# Patient Record
Sex: Female | Born: 1945 | Race: Black or African American | Hispanic: No | Marital: Married | Smoking: Never smoker
Health system: Southern US, Community
[De-identification: ages and names within clinical notes are randomized; demographics above are authoritative.]

## PROBLEM LIST (undated history)

## (undated) DIAGNOSIS — E119 Type 2 diabetes mellitus without complications: Secondary | ICD-10-CM

## (undated) DIAGNOSIS — E785 Hyperlipidemia, unspecified: Secondary | ICD-10-CM

## (undated) DIAGNOSIS — G20A1 Parkinson's disease without dyskinesia, without mention of fluctuations: Secondary | ICD-10-CM

## (undated) DIAGNOSIS — E162 Hypoglycemia, unspecified: Secondary | ICD-10-CM

## (undated) DIAGNOSIS — F0390 Unspecified dementia without behavioral disturbance: Secondary | ICD-10-CM

## (undated) DIAGNOSIS — G20 Parkinson's disease: Secondary | ICD-10-CM

## (undated) DIAGNOSIS — I10 Essential (primary) hypertension: Secondary | ICD-10-CM

## (undated) HISTORY — DX: Hyperlipidemia, unspecified: E78.5

---

## 2012-11-06 DIAGNOSIS — E162 Hypoglycemia, unspecified: Secondary | ICD-10-CM

## 2012-11-06 HISTORY — DX: Hypoglycemia, unspecified: E16.2

## 2012-11-24 ENCOUNTER — Encounter (HOSPITAL_COMMUNITY): Payer: Self-pay | Admitting: Vascular Surgery

## 2012-11-24 ENCOUNTER — Inpatient Hospital Stay (HOSPITAL_COMMUNITY)
Admission: EM | Admit: 2012-11-24 | Discharge: 2012-11-27 | DRG: 637 | Disposition: A | Discharge status: Home / Self Care | Payer: Medicare (Managed Care) | Attending: Internal Medicine | Admitting: Internal Medicine

## 2012-11-24 DIAGNOSIS — G9341 Metabolic encephalopathy: Secondary | ICD-10-CM | POA: Diagnosis present

## 2012-11-24 DIAGNOSIS — R634 Abnormal weight loss: Secondary | ICD-10-CM | POA: Diagnosis present

## 2012-11-24 DIAGNOSIS — Z7982 Long term (current) use of aspirin: Secondary | ICD-10-CM

## 2012-11-24 DIAGNOSIS — E1169 Type 2 diabetes mellitus with other specified complication: Principal | ICD-10-CM | POA: Diagnosis present

## 2012-11-24 DIAGNOSIS — E876 Hypokalemia: Secondary | ICD-10-CM | POA: Diagnosis present

## 2012-11-24 DIAGNOSIS — Z79899 Other long term (current) drug therapy: Secondary | ICD-10-CM

## 2012-11-24 DIAGNOSIS — G309 Alzheimer's disease, unspecified: Secondary | ICD-10-CM | POA: Diagnosis present

## 2012-11-24 DIAGNOSIS — G20A1 Parkinson's disease without dyskinesia, without mention of fluctuations: Secondary | ICD-10-CM

## 2012-11-24 DIAGNOSIS — N182 Chronic kidney disease, stage 2 (mild): Secondary | ICD-10-CM

## 2012-11-24 DIAGNOSIS — E162 Hypoglycemia, unspecified: Secondary | ICD-10-CM

## 2012-11-24 DIAGNOSIS — F0390 Unspecified dementia without behavioral disturbance: Secondary | ICD-10-CM

## 2012-11-24 DIAGNOSIS — I129 Hypertensive chronic kidney disease with stage 1 through stage 4 chronic kidney disease, or unspecified chronic kidney disease: Secondary | ICD-10-CM | POA: Diagnosis present

## 2012-11-24 DIAGNOSIS — E119 Type 2 diabetes mellitus without complications: Secondary | ICD-10-CM

## 2012-11-24 DIAGNOSIS — R4182 Altered mental status, unspecified: Secondary | ICD-10-CM

## 2012-11-24 DIAGNOSIS — I10 Essential (primary) hypertension: Secondary | ICD-10-CM

## 2012-11-24 DIAGNOSIS — E785 Hyperlipidemia, unspecified: Secondary | ICD-10-CM

## 2012-11-24 DIAGNOSIS — G20 Parkinson's disease: Secondary | ICD-10-CM

## 2012-11-24 DIAGNOSIS — F0280 Dementia in other diseases classified elsewhere without behavioral disturbance: Secondary | ICD-10-CM | POA: Diagnosis present

## 2012-11-24 DIAGNOSIS — Z885 Allergy status to narcotic agent status: Secondary | ICD-10-CM

## 2012-11-24 DIAGNOSIS — T383X5A Adverse effect of insulin and oral hypoglycemic [antidiabetic] drugs, initial encounter: Secondary | ICD-10-CM | POA: Diagnosis present

## 2012-11-24 DIAGNOSIS — Y92009 Unspecified place in unspecified non-institutional (private) residence as the place of occurrence of the external cause: Secondary | ICD-10-CM

## 2012-11-24 HISTORY — DX: Essential (primary) hypertension: I10

## 2012-11-24 HISTORY — DX: Unspecified dementia, unspecified severity, without behavioral disturbance, psychotic disturbance, mood disturbance, and anxiety: F03.90

## 2012-11-24 HISTORY — DX: Parkinson's disease: G20

## 2012-11-24 HISTORY — DX: Hypoglycemia, unspecified: E16.2

## 2012-11-24 HISTORY — DX: Type 2 diabetes mellitus without complications: E11.9

## 2012-11-24 HISTORY — DX: Parkinson's disease without dyskinesia, without mention of fluctuations: G20.A1

## 2012-11-24 LAB — GLUCOSE, CAPILLARY
Glucose-Capillary: 58 mg/dL — ABNORMAL LOW (ref 70–99)
Glucose-Capillary: 17 mg/dL — CL (ref 70–99)
Glucose-Capillary: 62 mg/dL — ABNORMAL LOW (ref 70–99)
Glucose-Capillary: 76 mg/dL (ref 70–99)

## 2012-11-24 LAB — URINALYSIS, ROUTINE W REFLEX MICROSCOPIC
Bilirubin Urine: NEGATIVE
Hgb urine dipstick: NEGATIVE
Nitrite: NEGATIVE
Protein, ur: 30 mg/dL — AB
Specific Gravity, Urine: 1.025 (ref 1.005–1.030)
Urobilinogen, UA: 0.2 mg/dL (ref 0.0–1.0)

## 2012-11-24 LAB — CBC WITH DIFFERENTIAL/PLATELET
Basophils Absolute: 0 10*3/uL (ref 0.0–0.1)
Basophils Relative: 0 % (ref 0–1)
Eosinophils Absolute: 0.1 10*3/uL (ref 0.0–0.7)
HCT: 32.7 % — ABNORMAL LOW (ref 36.0–46.0)
MCH: 28.4 pg (ref 26.0–34.0)
MCHC: 33.3 g/dL (ref 30.0–36.0)
Monocytes Absolute: 0.5 10*3/uL (ref 0.1–1.0)
Monocytes Relative: 6 % (ref 3–12)
Neutro Abs: 6.9 10*3/uL (ref 1.7–7.7)
Neutrophils Relative %: 81 % — ABNORMAL HIGH (ref 43–77)
RDW: 12.4 % (ref 11.5–15.5)

## 2012-11-24 LAB — POCT I-STAT, CHEM 8
Chloride: 107 mEq/L (ref 96–112)
HCT: 34 % — ABNORMAL LOW (ref 36.0–46.0)
Hemoglobin: 11.6 g/dL — ABNORMAL LOW (ref 12.0–15.0)
Potassium: 2.9 mEq/L — ABNORMAL LOW (ref 3.5–5.1)
Sodium: 143 mEq/L (ref 135–145)

## 2012-11-24 LAB — CREATININE, SERUM
Creatinine, Ser: 1.07 mg/dL (ref 0.50–1.10)
GFR calc Af Amer: 60 mL/min — ABNORMAL LOW (ref >90)

## 2012-11-24 LAB — CBC
Platelets: 156 10*3/uL (ref 150–400)
RBC: 3.39 MIL/uL — ABNORMAL LOW (ref 3.87–5.11)
RDW: 12.3 % (ref 11.5–15.5)
WBC: 7 10*3/uL (ref 4.0–10.5)

## 2012-11-24 LAB — URINE MICROSCOPIC-ADD ON

## 2012-11-24 LAB — MRSA PCR SCREENING: MRSA by PCR: NEGATIVE

## 2012-11-24 MED ORDER — LOSARTAN POTASSIUM 50 MG PO TABS
100.0000 mg | ORAL_TABLET | Freq: Every day | ORAL | Status: DC
  Administered 2012-11-25 – 2012-11-27 (×3): 100 mg via ORAL
  Filled 2012-11-24 (×3): qty 2

## 2012-11-24 MED ORDER — ONDANSETRON HCL 4 MG/2ML IJ SOLN: 4.0000 mg | Freq: Four times a day (QID) | INTRAMUSCULAR | Status: DC | PRN

## 2012-11-24 MED ORDER — ATENOLOL 50 MG PO TABS
50.0000 mg | ORAL_TABLET | Freq: Every day | ORAL | Status: DC
  Administered 2012-11-25 – 2012-11-27 (×3): 50 mg via ORAL
  Filled 2012-11-24 (×3): qty 1

## 2012-11-24 MED ORDER — ALBUTEROL SULFATE (5 MG/ML) 0.5% IN NEBU: 2.5000 mg | INHALATION_SOLUTION | RESPIRATORY_TRACT | Status: DC | PRN

## 2012-11-24 MED ORDER — SODIUM CHLORIDE 0.9 % IJ SOLN
3.0000 mL | Freq: Two times a day (BID) | INTRAMUSCULAR | Status: DC
  Administered 2012-11-24 – 2012-11-26 (×3): 3 mL via INTRAVENOUS

## 2012-11-24 MED ORDER — DEXTROSE 50 % IV SOLN
50.0000 mL | Freq: Once | INTRAVENOUS | Status: AC
  Administered 2012-11-24: 50 mL via INTRAVENOUS
  Filled 2012-11-24: qty 50

## 2012-11-24 MED ORDER — DEXTROSE 10 % IV SOLN
INTRAVENOUS | Status: DC
  Administered 2012-11-24 – 2012-11-25 (×3): via INTRAVENOUS

## 2012-11-24 MED ORDER — CARBIDOPA-LEVODOPA 25-100 MG PO TABS
1.0000 | ORAL_TABLET | Freq: Three times a day (TID) | ORAL | Status: DC
  Administered 2012-11-24 – 2012-11-27 (×8): 1 via ORAL
  Filled 2012-11-24 (×11): qty 1

## 2012-11-24 MED ORDER — DONEPEZIL HCL 5 MG PO TABS
5.0000 mg | ORAL_TABLET | Freq: Every day | ORAL | Status: DC
  Administered 2012-11-25 – 2012-11-27 (×3): 5 mg via ORAL
  Filled 2012-11-24 (×3): qty 1

## 2012-11-24 MED ORDER — ACETAMINOPHEN 650 MG RE SUPP: 650.0000 mg | Freq: Four times a day (QID) | RECTAL | Status: DC | PRN

## 2012-11-24 MED ORDER — OCTREOTIDE ACETATE 50 MCG/ML IJ SOLN
50.0000 ug | Freq: Three times a day (TID) | INTRAMUSCULAR | Status: DC | PRN
  Filled 2012-11-24: qty 1

## 2012-11-24 MED ORDER — ATORVASTATIN CALCIUM 80 MG PO TABS
80.0000 mg | ORAL_TABLET | Freq: Every evening | ORAL | Status: DC
  Administered 2012-11-24 – 2012-11-26 (×3): 80 mg via ORAL
  Filled 2012-11-24 (×4): qty 1

## 2012-11-24 MED ORDER — DEXTROSE 50 % IV SOLN: 1.0000 | Freq: Once | INTRAVENOUS | Status: DC

## 2012-11-24 MED ORDER — POTASSIUM CHLORIDE CRYS ER 20 MEQ PO TBCR
30.0000 meq | EXTENDED_RELEASE_TABLET | ORAL | Status: AC
  Administered 2012-11-24 – 2012-11-25 (×2): 30 meq via ORAL
  Filled 2012-11-24 (×3): qty 1

## 2012-11-24 MED ORDER — ONDANSETRON HCL 4 MG PO TABS: 4.0000 mg | ORAL_TABLET | Freq: Four times a day (QID) | ORAL | Status: DC | PRN

## 2012-11-24 MED ORDER — ZOLPIDEM TARTRATE 5 MG PO TABS: 5.0000 mg | ORAL_TABLET | Freq: Every evening | ORAL | Status: DC | PRN

## 2012-11-24 MED ORDER — ASPIRIN EC 81 MG PO TBEC
81.0000 mg | DELAYED_RELEASE_TABLET | Freq: Every day | ORAL | Status: DC
  Administered 2012-11-25 – 2012-11-26 (×2): 81 mg via ORAL
  Filled 2012-11-24 (×3): qty 1

## 2012-11-24 MED ORDER — DEXTROSE 50 % IV SOLN
25.0000 mL | Freq: Once | INTRAVENOUS | Status: AC
  Administered 2012-11-24: 25 mL via INTRAVENOUS
  Filled 2012-11-24: qty 50

## 2012-11-24 MED ORDER — HEPARIN SODIUM (PORCINE) 5000 UNIT/ML IJ SOLN
5000.0000 [IU] | Freq: Three times a day (TID) | INTRAMUSCULAR | Status: DC
  Administered 2012-11-24 – 2012-11-27 (×5): 5000 [IU] via SUBCUTANEOUS
  Filled 2012-11-24 (×11): qty 1

## 2012-11-24 MED ORDER — ALUM & MAG HYDROXIDE-SIMETH 200-200-20 MG/5ML PO SUSP: 30.0000 mL | Freq: Four times a day (QID) | ORAL | Status: DC | PRN

## 2012-11-24 MED ORDER — ACETAMINOPHEN 325 MG PO TABS
650.0000 mg | ORAL_TABLET | Freq: Four times a day (QID) | ORAL | Status: DC | PRN
  Administered 2012-11-25: 650 mg via ORAL
  Filled 2012-11-24: qty 2

## 2012-11-24 NOTE — ED Provider Notes (Signed)
I saw and evaluated the patient, reviewed the resident's note and I agree with the findings and plan.    Gilda Crease, MD 11/24/12 [phone]

## 2012-11-24 NOTE — ED Notes (Signed)
Pt noted to become shaky and diaphoretic. CBG checked and revealed CBG of 17 mg/dl. 25 mg of D50 given and D10 drip started. Pt also given orange juice and a meal. Will reassess in a few minutes.

## 2012-11-24 NOTE — ED Provider Notes (Signed)
I saw and evaluated the patient, reviewed the resident's note and I agree with the findings and plan.  Patient seen for refractory hypoglycemia. Patient is on an oral diabetes medication and has had recurrent hypotension despite multiple IV dextrose administrations. Patient will require hospitalization for further management.  Gilda Crease, MD 11/24/12 [phone]

## 2012-11-24 NOTE — H&P (Addendum)
PATIENT DETAILS Name: Kristie Ellison Age: 67 y.o. Sex: female Date of Birth: 1946/02/03 Admit Date: 11/24/2012 PCP:Pcp Not In System   CHIEF COMPLAINT:  Hypoglycemia  HPI: Patient is a 67 year old African American female with a history of type 2 diabetes on glimepiride, hypertension, moderate to severe dementia, Parkinson's disease who recently moved down to [place] from [place] to be closer to her daughter was brought to the hospital for evaluation of hypoglycemic symptoms. Apparently the patient was in his usual state of health until last evening when she was noticed to be more tremulous, diaphoretic and just not acting herself. Her family members did not check her sugars then, early this morning she again had recurrence of the symptoms prompting her daughter to check her sugar which was in the 30s. Patient was then brought to the emergency room, where she was found to be persistently hypoglycemic, she was placed on a D. 10 infusion and I was called to admit this patient for further evaluation and treatment Patient does not have a history of headaches, fever, nausea, vomiting, diarrhea. Last dose of amaryl was earlier this am   ALLERGIES:   Allergies  Allergen Reactions  . Codeine Anxiety    Pt reports she goes crazy    PAST MEDICAL HISTORY: Past Medical History  Diagnosis Date  . Diabetes mellitus without complication   . Hypertension   . Dementia   . Parkinson disease     PAST SURGICAL HISTORY: History reviewed. No pertinent past surgical history.  MEDICATIONS AT HOME: Prior to Admission medications   Medication Sig Start Date End Date Taking? Authorizing Provider  aspirin EC 81 MG tablet Take 81 mg by mouth daily at 2 PM daily at 2 PM.   Yes Historical Provider, MD  atenolol (TENORMIN) 50 MG tablet Take 50 mg by mouth daily at 2 PM daily at 2 PM.   Yes Historical Provider, MD  atorvastatin (LIPITOR) 80 MG tablet Take 80 mg by mouth every evening.   Yes Historical  Provider, MD  carbidopa-levodopa (SINEMET IR) 25-100 MG per tablet Take 1 tablet by mouth 3 (three) times daily.   Yes Historical Provider, MD  Cholecalciferol (VITAMIN D PO) Take 1 capsule by mouth once a week. sunday   Yes Historical Provider, MD  donepezil (ARICEPT) 5 MG tablet Take 5 mg by mouth daily at 2 PM daily at 2 PM.   Yes Historical Provider, MD  glimepiride (AMARYL) 4 MG tablet Take 4 mg by mouth daily before breakfast.   Yes Historical Provider, MD  losartan (COZAAR) 100 MG tablet Take 100 mg by mouth daily at 2 PM daily at 2 PM.   Yes Historical Provider, MD    FAMILY HISTORY: No significant family history of coronary artery disease.  SOCIAL HISTORY:  reports that she has never smoked. She does not have any smokeless tobacco history on file. She reports that she does not drink alcohol or use illicit drugs.  REVIEW OF SYSTEMS:  Constitutional:   No  weight loss,   Fevers, chills, fatigue.  HEENT:    No headaches, Difficulty swallowing,Tooth/dental problems,Sore throat,  No sneezing, itching, ear ache, nasal congestion, post nasal drip,   Cardio-vascular: No chest pain,  Orthopnea, PND, swelling in lower extremities, anasarca, dizziness, palpitations  GI:  No heartburn, indigestion, abdominal pain, nausea, vomiting, diarrhea, change in  bowel habits, loss of appetite  Resp: No shortness of breath with exertion or at rest.  No excess mucus, no productive cough, No non-productive cough,  No coughing up of blood.No change in color of mucus.No wheezing.No chest wall deformity  Skin:  no rash or lesions.  GU:  no dysuria, change in color of urine, no urgency or frequency.  No flank pain.  Musculoskeletal: No joint pain or swelling.  No decreased range of motion.  No back pain.  Psych: No change in mood or affect. No depression or anxiety.  No memory loss.   PHYSICAL EXAM: Blood pressure 125/66, pulse 92, temperature 98.3 F (36.8 C), temperature source Oral,  resp. rate 24, SpO2 99.00%.  General appearance :Awake, mostly alert alert, not in any distress. Speech Clear. Not toxic Looking HEENT: Atraumatic and Normocephalic, pupils equally reactive to light and accomodation Neck: supple, no JVD. No cervical lymphadenopathy.  Chest:Good air entry bilaterally, no added sounds  CVS: S1 S2 regular, no murmurs.  Abdomen: Bowel sounds present, Non tender and not distended with no gaurding, rigidity or rebound. Extremities: B/L Lower Ext shows no edema, both legs are warm to touch Neurology: Awake alert, and oriented X 3, CN II-XII intact, Non focal Skin:No Rash Wounds:N/A  LABS ON ADMISSION:   Recent Labs  11/24/12 1613  NA 143  K 2.9*  CL 107  GLUCOSE 57*  BUN 24*  CREATININE 1.20*   No results found for this basename: AST, ALT, ALKPHOS, BILITOT, PROT, ALBUMIN,  in the last 72 hours No results found for this basename: LIPASE, AMYLASE,  in the last 72 hours  Recent Labs  11/24/12 1513 11/24/12 1613  WBC 8.5  --   NEUTROABS 6.9  --   HGB 10.9* 11.6*  HCT 32.7* 34.0*  MCV 85.2  --   PLT 178  --    No results found for this basename: CKTOTAL, CKMB, CKMBINDEX, TROPONINI,  in the last 72 hours No results found for this basename: DDIMER,  in the last 72 hours No components found with this basename: POCBNP,    RADIOLOGIC STUDIES ON ADMISSION: No results found.  ASSESSMENT AND PLAN: Present on Admission:  . Hypoglycemia - Likely secondary to oral hypoglycemic agents-namely Glimiperide. She does have mild renal insufficiency, likely contributing as well.  - Admit to step down unit, continue with D. 10 infusion, if need be we can give her more dextrose infusion. Will use as needed D50 for CBGs less than 60, if her hypoglycemia is still persistent, will use subcutaneous octreotide injections.  - Check A1c, we need to liberalize glycemic control-avoid tight glycemic control in the future. Apparently this is her second episode of  hypoglycemia, she had a similar episode in January of February of last year while she was in [place]  - Placed on a regular diet for now  . Altered mental status - Likely metabolic encephalopathy from severe hypoglycemia-seems to have resolved during my evaluation. Per her daughter-currently close to her baseline   . Diabetes mellitus - Hold all oral hypoglycemic agents for now, check A1c. Rest as above   . HTN (hypertension) - Continue with her home regimen - monitor BP closely and adjust medications accordingly   . Dementia - At baseline.  - Continue Aricept   . Parkinson disease - Continue with Sinemet  Further plan will depend as patient's clinical course evolves and further radiologic and laboratory data become available. Patient will be monitored closely.   DVT Prophylaxis: - Subcutaneous heparin  Code Status: - Full code  Total time spent for admission equals 45 minutes.  [facility] Pager [phone]  If 7PM-7AM, please contact  night-coverage [email or web address] Password [facility] 11/24/2012, 5:08 PM

## 2012-11-24 NOTE — ED Provider Notes (Signed)
History     CSN: [id number]  Arrival date & time 11/24/12  1522  Chief Complaint  Patient presents with  . Hypoglycemia    HPI  67 y/o female with history of Parkinson's dementia, HTN, DM (on oral medications) who presents with cc of AMS and hypoglycemia. The patient's daughter primarily manages the patient's medications. She states that this morning the patient received her usual dose of glimepiride. She states that this morning the patient appeared to be clammy, diaphoretic, and more confused that normal. Today the patient became acutely lethargic and confused. The patient's daughter took her blood sugar and realized it was 37. When EMS arrived the patient received 25 g of glucose. The patient's daughter states the patient had a normal breakfast and lunch today. The patient's daughter states the patient had a similar episode a few months prior. No new medication changes. No infectious symptoms.   Past Medical History  Diagnosis Date  . Diabetes mellitus without complication   . Hypertension   . Dementia   . Parkinson disease     History reviewed. No pertinent past surgical history.  History reviewed. No pertinent family history.  History  Substance Use Topics  . Smoking status: Never Smoker   . Smokeless tobacco: Not on file  . Alcohol Use: No    OB History   Grav Para Term Preterm Abortions TAB SAB Ect Mult Living                 Review of Systems  Constitutional: Positive for diaphoresis. Negative for fever and chills.  HENT: Negative for congestion and rhinorrhea.   Respiratory: Negative for cough.   Cardiovascular: Negative for chest pain.  Gastrointestinal: Negative for nausea, vomiting and abdominal pain.  Genitourinary: Negative for dysuria and frequency.  Skin: Negative for rash.  Psychiatric/Behavioral: Positive for confusion.  All other systems reviewed and are negative.   Allergies  Codeine  Home Medications   Current Outpatient Rx  Name  Route  Sig   Dispense  Refill  . aspirin EC 81 MG tablet   Oral   Take 81 mg by mouth daily at 2 PM daily at 2 PM.         . atenolol (TENORMIN) 50 MG tablet   Oral   Take 50 mg by mouth daily at 2 PM daily at 2 PM.         . atorvastatin (LIPITOR) 80 MG tablet   Oral   Take 80 mg by mouth every evening.         . carbidopa-levodopa (SINEMET IR) 25-100 MG per tablet   Oral   Take 1 tablet by mouth 3 (three) times daily.         . Cholecalciferol (VITAMIN D PO)   Oral   Take 1 capsule by mouth once a week. sunday         . donepezil (ARICEPT) 5 MG tablet   Oral   Take 5 mg by mouth daily at 2 PM daily at 2 PM.         . glimepiride (AMARYL) 4 MG tablet   Oral   Take 4 mg by mouth daily before breakfast.         . losartan (COZAAR) 100 MG tablet   Oral   Take 100 mg by mouth daily at 2 PM daily at 2 PM.           BP 141/65  Pulse 79  Temp(Src) 98.3 F (36.8 C) (Oral)  Resp 21  SpO2 100%  Physical Exam  Nursing note and vitals reviewed. Constitutional: She appears well-developed and well-nourished. No distress.  HENT:  Head: Normocephalic and atraumatic.  Mouth/Throat: No oropharyngeal exudate.  Eyes: Conjunctivae are normal. Pupils are equal, round, and reactive to light.  Neck: Normal range of motion. Neck supple.  Cardiovascular: Normal rate and regular rhythm.  Exam reveals no gallop and no friction rub.   No murmur heard. Pulmonary/Chest: Effort normal and breath sounds normal.  Abdominal: Soft. She exhibits no distension. There is no tenderness.  Musculoskeletal: Normal range of motion. She exhibits no edema and no tenderness.  Neurological: She is alert. She has normal strength. No cranial nerve deficit or sensory deficit. GCS eye subscore is 4. GCS verbal subscore is 5. GCS motor subscore is 6.  AAO to self and place. Not oriented to date. (baseline per family).  Skin: Skin is warm and dry.  Psychiatric: She has a normal mood and affect.    ED  Course  Procedures (including critical care time)  Labs Reviewed  GLUCOSE, CAPILLARY - Abnormal; Notable for the following:    Glucose-Capillary 26 (*)    All other components within normal limits  CBC WITH DIFFERENTIAL - Abnormal; Notable for the following:    RBC 3.84 (*)    Hemoglobin 10.9 (*)    HCT 32.7 (*)    Neutrophils Relative 81 (*)    All other components within normal limits  URINALYSIS, ROUTINE W REFLEX MICROSCOPIC - Abnormal; Notable for the following:    Glucose, UA 100 (*)    Protein, ur 30 (*)    Leukocytes, UA SMALL (*)    All other components within normal limits  GLUCOSE, CAPILLARY - Abnormal; Notable for the following:    Glucose-Capillary 58 (*)    All other components within normal limits  GLUCOSE, CAPILLARY - Abnormal; Notable for the following:    Glucose-Capillary 17 (*)    All other components within normal limits  URINE MICROSCOPIC-ADD ON - Abnormal; Notable for the following:    Squamous Epithelial / LPF MANY (*)    Bacteria, UA MANY (*)    All other components within normal limits  GLUCOSE, CAPILLARY - Abnormal; Notable for the following:    Glucose-Capillary 62 (*)    All other components within normal limits  POCT I-STAT, CHEM 8 - Abnormal; Notable for the following:    Potassium 2.9 (*)    BUN 24 (*)    Creatinine, Ser 1.20 (*)    Glucose, Bld 57 (*)    Hemoglobin 11.6 (*)    HCT 34.0 (*)    All other components within normal limits  URINE CULTURE  GLUCOSE, CAPILLARY   No results found.   1. Hypoglycemia   2. Altered mental status   3. Dementia   4. Diabetes mellitus     MDM   67 y/o female with history of Parkinson's dementia, HTN, DM (on oral medications) who presents with cc of AMS and hypoglycemia. Afebrile. Non-focal neurological exam. BS 26 on arrival. Family reports administering home medication this morning as usual. No recent infectious symptoms. The patient ate today as usual. No vomiting. Hypoglycemia could be secondary  to inadvertent extra dosage of glimerpide or infection. In addition, it is possible that the patient is now having tighter glucose control secondary to weight loss over the last year. Family is unsure of last A1C but the daughter states she checks her blood glucose every 3 days and the highest  reading lately was ~120. Repeat FSBG after D50 was persistently hypoglycemic. The patient was given and additional 0.5 amp and started on a D10 gtt. The patient was admitted to general medicine stepdown unit for further management.     Shanon Ace, MD 11/24/12 [phone]

## 2012-11-24 NOTE — Progress Notes (Signed)
Pt admitted from ED, alert, confused, CBG stable, VSS, family oriented to unit and routines, bed alarm set, family verbalizes understanding of safety and unit routines

## 2012-11-24 NOTE — ED Notes (Signed)
Pt reports to the ED for eval of hypoglycemia. Yesterday evening pt had an episode where she became clammy and diaphoretic and more confused than normal however it resolved. Pt experienced another episode of these symptoms today and she went unresponsive. Upon EMS arrival pts CBG 31 mg/dl. Pt has 25 g of dextrose PTA. CBG 58 mg/dl en route. Pt has hx of dementia. Pt is oriented to person only. Per family pts mental status is baseline for her. Pts only complaint at this time is she feels cold. Pt in NAD and denies any pain.

## 2012-11-25 ENCOUNTER — Encounter (HOSPITAL_COMMUNITY): Payer: Self-pay | Admitting: General Practice

## 2012-11-25 DIAGNOSIS — N182 Chronic kidney disease, stage 2 (mild): Secondary | ICD-10-CM | POA: Diagnosis present

## 2012-11-25 DIAGNOSIS — E785 Hyperlipidemia, unspecified: Secondary | ICD-10-CM | POA: Diagnosis present

## 2012-11-25 LAB — CBC
HCT: 29.8 % — ABNORMAL LOW (ref 36.0–46.0)
Hemoglobin: 10 g/dL — ABNORMAL LOW (ref 12.0–15.0)
MCH: 28.5 pg (ref 26.0–34.0)
MCHC: 33.6 g/dL (ref 30.0–36.0)
MCV: 84.9 fL (ref 78.0–100.0)
RBC: 3.51 MIL/uL — ABNORMAL LOW (ref 3.87–5.11)

## 2012-11-25 LAB — MAGNESIUM: Magnesium: 1.7 mg/dL (ref 1.5–2.5)

## 2012-11-25 LAB — COMPREHENSIVE METABOLIC PANEL
ALT: 5 U/L (ref 0–35)
BUN: 17 mg/dL (ref 6–23)
CO2: 22 mEq/L (ref 19–32)
Calcium: 8.9 mg/dL (ref 8.4–10.5)
Creatinine, Ser: 1 mg/dL (ref 0.50–1.10)
GFR calc Af Amer: 66 mL/min — ABNORMAL LOW (ref >90)
GFR calc non Af Amer: 57 mL/min — ABNORMAL LOW (ref >90)
Glucose, Bld: 111 mg/dL — ABNORMAL HIGH (ref 70–99)
Sodium: 141 mEq/L (ref 135–145)
Total Protein: 5.9 g/dL — ABNORMAL LOW (ref 6.0–8.3)

## 2012-11-25 LAB — URINE CULTURE: Culture: NO GROWTH

## 2012-11-25 LAB — GLUCOSE, CAPILLARY
Glucose-Capillary: 69 mg/dL — ABNORMAL LOW (ref 70–99)
Glucose-Capillary: 73 mg/dL (ref 70–99)
Glucose-Capillary: 101 mg/dL — ABNORMAL HIGH (ref 70–99)
Glucose-Capillary: 121 mg/dL — ABNORMAL HIGH (ref 70–99)

## 2012-11-25 LAB — HEMOGLOBIN A1C: Hgb A1c MFr Bld: 5.1 % (ref <5.7)

## 2012-11-25 MED ORDER — DEXTROSE 5 % IV SOLN
INTRAVENOUS | Status: DC
  Administered 2012-11-25: 50 mL via INTRAVENOUS

## 2012-11-25 MED ORDER — PNEUMOCOCCAL VAC POLYVALENT 25 MCG/0.5ML IJ INJ
0.5000 mL | INJECTION | INTRAMUSCULAR | Status: AC
  Filled 2012-11-25: qty 0.5

## 2012-11-25 MED ORDER — INFLUENZA VIRUS VACC SPLIT PF IM SUSP
0.5000 mL | INTRAMUSCULAR | Status: AC
  Filled 2012-11-25: qty 0.5

## 2012-11-25 NOTE — Progress Notes (Signed)
Spoke with patients daughter about flu/ PNA vaccines, daughter states she wants to talk it over with her other sister and they will let us know.

## 2012-11-25 NOTE — Progress Notes (Signed)
Patient being transferred to [facility] per MD , Report called to [facility], patient will transfer in wheel chair.

## 2012-11-25 NOTE — Progress Notes (Signed)
Inpatient Diabetes Program Recommendations  AACE/ADA: New Consensus Statement on Inpatient Glycemic Control (2013)  Target Ranges:  Prepandial:   less than 140 mg/dL      Peak postprandial:   less than 180 mg/dL (1-2 hours)      Critically ill patients:  140 - 180 mg/dL   Reason for Visit: CBGs 3/20  115-121-222 mg/dl       2/13  08/65-78-46-96/29 mg/dl  Inpatient Diabetes Program Recommendations Correction (SSI): start Novolog SENSITIVE correction scale AC & HS if CBGs continue greater than 180 mg/dl.  Note:

## 2012-11-25 NOTE — Evaluation (Signed)
Physical Therapy Evaluation Patient Details Name: Kristie Ellison MRN: [id number] DOB: 05/15/1946 Today's Date: 11/25/2012 Time: 4098-1191 PT Time Calculation (min): 32 min  PT Assessment / Plan / Recommendation Clinical Impression  67 y.o. female admitted to [facility] for hypoglycemia and has h/o Parkinson's and baseline dementia.  She would benefit from HHPT f/u at discharge to work on gait and balance.      PT Assessment  Patient needs continued PT services    Follow Up Recommendations  Home health PT;Supervision/Assistance - 24 hour    Does the patient have the potential to tolerate intense rehabilitation     NA  Barriers to Discharge None None    Equipment Recommendations  None recommended by PT    Recommendations for Other Services   none  Frequency Min 3X/week    Precautions / Restrictions Precautions Precautions: Fall Precaution Comments: pt standing in middle of room staggering around without RW wraped up in IV when PT entered room.  Must have bed or chair alarm.     Pertinent Vitals/Pain No reports of pain      Mobility  Bed Mobility Bed Mobility: Sit to Supine Sit to Supine: 3: Mod assist Details for Bed Mobility Assistance: mod assist to help bil legs back into the bed Transfers Transfers: Sit to Stand;Stand to Sit Sit to Stand: 4: Min assist;With upper extremity assist;From bed Stand to Sit: 4: Min assist;With upper extremity assist;To bed;To toilet Details for Transfer Assistance: min assist to steady pt for balance during transitions Ambulation/Gait Ambulation/Gait Assistance: 4: Min assist Ambulation Distance (Feet): 100 Feet Assistive device: Rolling walker Ambulation/Gait Assistance Details: min assist for 2-3 significant LOB that if PT had not been there would have resulted in a fall even with RW.   Gait Pattern: Step-through pattern;Shuffle;Trunk flexed (staggering to left and right.  ) Gait velocity: less than 1.8 ft/sec putting her at risk for  recurrent falls General Gait Details: Pt easily distracted in busy hallway.  Difficult to keep focused on walking task and increased LOB while trying to talk to everyone in the hallway.          PT Diagnosis: Difficulty walking;Abnormality of gait;Generalized weakness;Altered mental status  PT Problem List: Decreased strength;Decreased activity tolerance;Decreased balance;Decreased mobility;Decreased cognition;Decreased safety awareness PT Treatment Interventions: DME instruction;Gait training;Stair training;Functional mobility training;Therapeutic activities;Therapeutic exercise;Balance training;Neuromuscular re-education;Cognitive remediation;Patient/family education   PT Goals Acute Rehab PT Goals PT Goal Formulation: Patient unable to participate in goal setting Time For Goal Achievement: 11/25/12 Potential to Achieve Goals: Good Pt will go Supine/Side to Sit: with supervision PT Goal: Supine/Side to Sit - Progress: Goal set today Pt will go Sit to Supine/Side: with supervision PT Goal: Sit to Supine/Side - Progress: Goal set today Pt will go Sit to Stand: with supervision PT Goal: Sit to Stand - Progress: Goal set today Pt will go Stand to Sit: with supervision PT Goal: Stand to Sit - Progress: Goal set today Pt will Transfer Bed to Chair/Chair to Bed: with supervision PT Transfer Goal: Bed to Chair/Chair to Bed - Progress: Goal set today Pt will Ambulate: >150 feet;with supervision;with rolling walker PT Goal: Ambulate - Progress: Goal set today Pt will Go Up / Down Stairs: 3-5 stairs;with min assist;with least restrictive assistive device PT Goal: Up/Down Stairs - Progress: Goal set today  Visit Information  Last PT Received On: 11/25/12 Assistance Needed: +1    Subjective Data  Subjective: Pt reports that she is in [place] and I can't convince her that she is  not.  H/o Parkinson's dementia   Prior Functioning  Home Living Lives With: Family Available Help at  Discharge: Family Additional Comments: unable to fully assess due to pt's cognition Prior Function Level of Independence: Needs assistance Comments: walks with RW at home Communication Communication: No difficulties    Cognition  Cognition Overall Cognitive Status: History of cognitive impairments - at baseline Arousal/Alertness: Awake/alert Orientation Level: Disoriented to;Place;Time;Situation Behavior During Session: Restless Cognition - Other Comments: impulsive with decreased sustained attention    Extremity/Trunk Assessment Right Lower Extremity Assessment RLE Sensation: Deficits RLE Sensation Deficits: 3+/5 per gross functional assessment Left Lower Extremity Assessment LLE ROM/Strength/Tone: Deficits LLE ROM/Strength/Tone Deficits: grossly 3+/5 per gross functional assessment   Balance Static Sitting Balance Static Sitting - Balance Support: Bilateral upper extremity supported;Feet supported Static Sitting - Level of Assistance: 5: Stand by assistance Dynamic Sitting Balance Dynamic Sitting - Balance Support: No upper extremity supported;Feet supported Dynamic Sitting - Level of Assistance: 4: Min assist Dynamic Sitting - Comments: min assist while trying to donn socks in sitting,  LOB anteriorly Static Standing Balance Static Standing - Balance Support: Bilateral upper extremity supported Static Standing - Level of Assistance: 5: Stand by assistance Static Standing - Comment/# of Minutes: while washing hands at sink Dynamic Standing Balance Dynamic Standing - Balance Support: No upper extremity supported Dynamic Standing - Level of Assistance: 4: Min assist Dynamic Standing - Comments: min assist while performing toileting tasks in standing.    End of Session PT - End of Session Activity Tolerance: Patient limited by fatigue Patient left: in bed;with call bell/phone within reach;with bed alarm set       Pasqualino Witherspoon B. Montray Kliebert, PT, DPT [phone]   11/25/2012, 4:37  PM

## 2012-11-25 NOTE — Progress Notes (Signed)
[facility] Progress Note [place] TEAM 1 - Stepdown/ICU TEAM   Kateleen Encarnacion ZOX:[id number] DOB: Dec 21, 1945 DOA: 11/24/2012 PCP: Pcp Not In System  Brief narrative: 67 year old female patient with known type 2 diabetes on midparietal also has hypertension. 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 Because of this her family moved her from [place] to [place]. She was brought to the hospital because of symptomatic hypoglycemia. When the patient began having diaphoresis with tremulousness and altered behavior the daughter checked her blood sugar and this was found to be in the 30s. She was subsequently brought to the ER and placed on a D10 infusion. Patient normally takes Amaryl at home for diabetes.  Assessment/Plan: Active Problems:   Hypoglycemia/Diabetes mellitus type 2, controlled, without complications -CBG >100 on D10 infusion- will decrease to D5W and follow -change CBG check to q 4 hrs -continue regular diet until establishes more consistent CBG readings -dtr says pt with significant weight loss over past 8 months- likely this weight loss lead to lower need for glycemic control -+/- will need rxn tx for diabetes after dc    Altered mental status -due to hypoglycemia- resolving -influenced by pts Parkinson's dementia    HTN (hypertension) -controlled -cont. Tenormin and Cozaar    CKD (chronic kidney disease) stage 2, GFR 60-89 ml/min -follow- GFR around 60    Dementia/Parkinson disease -continue Sinemet and Aricept    Dyslipidemia -cont. lipitor   DVT prophylaxis: Subcutaneous heparin Code Status: Full Family Communication: Daughter and patient Disposition Plan: Transfer to floor Isolation: None  Consultants: None  Procedures: None  Antibiotics: None  HPI/Subjective: Patient alert but clearly has short-term memory as well as processing deficits related to her underlying dementia. Daughter in room and multiple questions  answered. Daughter clarifies patient with significant weight loss over the past few months.   Objective: Blood pressure 129/66, pulse 77, temperature 98.2 F (36.8 C), temperature source Oral, resp. rate 17, height 5\' 5"  (1.651 m), weight 56.1 kg (123 lb 10.9 oz), SpO2 100.00%.  Intake/Output Summary (Last 24 hours) at 11/25/12 1335 Last data filed at 11/25/12 1200  Gross per 24 hour  Intake    483 ml  Output    700 ml  Net   -217 ml     Exam: General: No acute respiratory distress Lungs: Clear to auscultation bilaterally without wheezes or crackles Cardiovascular: Regular rate and rhythm without murmur gallop or rub normal S1 and S2 Abdomen: Nontender, nondistended, soft, bowel sounds positive, no rebound, no ascites, no appreciable mass Extremities: No significant cyanosis, clubbing, or edema bilateral lower extremities  Data Reviewed: Basic Metabolic Panel:  Recent Labs Lab 11/24/12 1613 11/24/12 1941 11/25/12 0330  NA 143  --  141  K 2.9*  --  3.2*  CL 107  --  109  CO2  --   --  22  GLUCOSE 57*  --  111*  BUN 24*  --  17  CREATININE 1.20* 1.07 1.00  CALCIUM  --   --  8.9  MG  --   --  1.7   Liver Function Tests:  Recent Labs Lab 11/25/12 0330  AST 30  ALT 5  ALKPHOS 63  BILITOT 0.3  PROT 5.9*  ALBUMIN 3.1*   No results found for this basename: LIPASE, AMYLASE,  in the last 168 hours No results found for this basename: AMMONIA,  in the last 168 hours CBC:  Recent Labs Lab 11/24/12 1513 11/24/12 1613 11/24/12 1941 11/25/12 0330  WBC 8.5  --  7.0 6.0  NEUTROABS 6.9  --   --   --   HGB 10.9* 11.6* 9.6* 10.0*  HCT 32.7* 34.0* 29.0* 29.8*  MCV 85.2  --  85.5 84.9  PLT 178  --  156 158   Cardiac Enzymes: No results found for this basename: CKTOTAL, CKMB, CKMBINDEX, TROPONINI,  in the last 168 hours BNP (last 3 results) No results found for this basename: PROBNP,  in the last 8760 hours CBG:  Recent Labs Lab 11/25/12 0149 11/25/12 0337  11/25/12 0611 11/25/12 0741 11/25/12 1134  GLUCAP 87 101* 115* 121* 222*    Recent Results (from the past 240 hour(s))  MRSA PCR SCREENING     Status: None   Collection Time    11/24/12  6:46 PM      Result Value Range Status   MRSA by PCR NEGATIVE  NEGATIVE Final   Comment:            The GeneXpert MRSA Assay (FDA     approved for NASAL specimens     only), is one component of a     comprehensive MRSA colonization     surveillance program. It is not     intended to diagnose MRSA     infection nor to guide or     monitor treatment for     MRSA infections.     Studies:  Recent x-ray studies have been reviewed in detail by the Attending Physician  Scheduled Meds:  Reviewed in detail by the Attending Physician   Junious Silk, ANP [facility] Office  [phone] Pager [phone]  On-Call/Text Page:      [email or web address]      password [email or web address]  If 7PM-7AM, please contact night-coverage [email or web address] Password [email or web address] 11/25/2012, 1:35 PM   LOS: 1 day

## 2012-11-25 NOTE — Progress Notes (Signed)
Utilization review completed.  

## 2012-11-25 NOTE — Progress Notes (Signed)
Patient seen and examined. Agree with note by Junious Silk, NP. Patient admitted for severe hypoglycemia. Thought related to amaryl use. Patient has lost 40 pounds, and likely her diabetic meds requirement have gone down significantly. She is on a D-10 drip and CBGs remain in the low 100s. Will decrease to a D-5 drip and change CBGs to q 4 hours. Can transfer to a regular bed today. If CBGs stable overnight, can take her off the dextrose drip and give her another 24 hours to see if her CBGs stabilize. If so, she can be discharged home in aprox 48 hours. I would recommend no oral hypoglycemic agents at this time.  Peggye Pitt, MD [facility] Pager: [phone]

## 2012-11-26 DIAGNOSIS — N182 Chronic kidney disease, stage 2 (mild): Secondary | ICD-10-CM

## 2012-11-26 LAB — GLUCOSE, CAPILLARY
Glucose-Capillary: 109 mg/dL — ABNORMAL HIGH (ref 70–99)
Glucose-Capillary: 111 mg/dL — ABNORMAL HIGH (ref 70–99)
Glucose-Capillary: 117 mg/dL — ABNORMAL HIGH (ref 70–99)
Glucose-Capillary: 125 mg/dL — ABNORMAL HIGH (ref 70–99)
Glucose-Capillary: 138 mg/dL — ABNORMAL HIGH (ref 70–99)

## 2012-11-26 MED ORDER — POTASSIUM CHLORIDE CRYS ER 20 MEQ PO TBCR
40.0000 meq | EXTENDED_RELEASE_TABLET | Freq: Once | ORAL | Status: AC
  Administered 2012-11-26: 40 meq via ORAL
  Filled 2012-11-26: qty 2

## 2012-11-26 MED ORDER — ENSURE COMPLETE PO LIQD
237.0000 mL | Freq: Two times a day (BID) | ORAL | Status: DC
  Administered 2012-11-26 (×2): 237 mL via ORAL

## 2012-11-26 MED ORDER — INSULIN ASPART 100 UNIT/ML ~~LOC~~ SOLN: 0.0000 [IU] | Freq: Every day | SUBCUTANEOUS | Status: DC

## 2012-11-26 MED ORDER — INSULIN ASPART 100 UNIT/ML ~~LOC~~ SOLN: 0.0000 [IU] | Freq: Three times a day (TID) | SUBCUTANEOUS | Status: DC

## 2012-11-26 MED ORDER — INSULIN ASPART 100 UNIT/ML ~~LOC~~ SOLN: 1.0000 [IU] | Freq: Three times a day (TID) | SUBCUTANEOUS | Status: DC

## 2012-11-26 MED ORDER — INSULIN PEN STARTER KIT
1.0000 | Freq: Once | Status: AC
  Administered 2012-11-26: 1
  Filled 2012-11-26: qty 1

## 2012-11-26 NOTE — Progress Notes (Signed)
Patient ID: Kristie Ellison  female  ZOX:[id number]    DOB: 02/13/1946    DOA: 11/24/2012  PCP: Georgann Housekeeper, MD  Assessment/Plan: Principal Problem:   Hypoglycemia with history of type 2 diabetes on oral hypoglycemics, daughter reported pt with significant weight loss over past 8 months- likely this weight loss lead to lower need for glycemic control, poor appetite  - Discontinued D5 drip, will follow CBGs for next 24 hours without any intervention - Placed her on custom sliding scale insulin, discussed with diabetic coordinator to keep it liberal to avoid hypoglycemia at home - Diabetic teaching, insulin use provided to the daughter  Active Problems:   Altered mental status- likely due to severe hypoglycemia and Alzheimer's/Parkinson's dementia - Per daughter improving a    HTN (hypertension) - Controlled, continue Tenormin and Cozaar    CKD (chronic kidney disease) stage 2, GFR 60-89 ml/min    Dyslipidemia Continue Lipitor   Dementia/Parkinson disease  -continue Sinemet and Aricept   DVT Prophylaxis:  Code Status: FC  Disposition: Discussed with patient's daughter at bedside, will also do PT, OT evaluation for further DME needs. The patient and her husband have recently moved from [place] to live with her daughter Kristie Ellison). She does not want her mother to be in skilled nursing facility as long as she can manage. She has an appointment with Dr. Maxwell Caul as new patient on 12/07/12 at 10:00 am (I added it to the AVS).   Patient will need a prescription for NovoLog flex pens (USE CUSTOM SCALE), needles, Lancet strips, glucometer. Home health PT, Aid, RN. The customed SSI should be used for NovoLog insulin at home on presciption/AVS.   Subjective: Patient sitting up in the chair, husband at the bedside denies any complaints. blood sugars controlled currently on D5 drip   Objective: Weight change:   Intake/Output Summary (Last 24 hours) at 11/26/12 1317 Last data filed at  11/25/12 1800  Gross per 24 hour  Intake    640 ml  Output      0 ml  Net    640 ml   Blood pressure 162/84, pulse 87, temperature 98.2 F (36.8 C), temperature source Oral, resp. rate 17, height 5\' 5"  (1.651 m), weight 56.1 kg (123 lb 10.9 oz), SpO2 100.00%.  Physical Exam: General: Alert and awake, oriented x3, not in any acute distress. CVS: S1-S2 clear, no murmur rubs or gallops Chest: clear to auscultation bilaterally, no wheezing, rales or rhonchi Abdomen: soft nontender, nondistended, normal bowel sounds Extremities: no cyanosis, clubbing or edema noted bilaterally   Lab Results: Basic Metabolic Panel:  Recent Labs Lab 11/24/12 1613 11/24/12 1941 11/25/12 0330  NA 143  --  141  K 2.9*  --  3.2*  CL 107  --  109  CO2  --   --  22  GLUCOSE 57*  --  111*  BUN 24*  --  17  CREATININE 1.20* 1.07 1.00  CALCIUM  --   --  8.9  MG  --   --  1.7   Liver Function Tests:  Recent Labs Lab 11/25/12 0330  AST 30  ALT 5  ALKPHOS 63  BILITOT 0.3  PROT 5.9*  ALBUMIN 3.1*   No results found for this basename: LIPASE, AMYLASE,  in the last 168 hours No results found for this basename: AMMONIA,  in the last 168 hours CBC:  Recent Labs Lab 11/24/12 1513  11/24/12 1941 11/25/12 0330  WBC 8.5  --  7.0 6.0  NEUTROABS 6.9  --   --   --   HGB 10.9*  < > 9.6* 10.0*  HCT 32.7*  < > 29.0* 29.8*  MCV 85.2  --  85.5 84.9  PLT 178  --  156 158  < > = values in this interval not displayed. Cardiac Enzymes: No results found for this basename: CKTOTAL, CKMB, CKMBINDEX, TROPONINI,  in the last 168 hours BNP: No components found with this basename: POCBNP,  CBG:  Recent Labs Lab 11/25/12 2025 11/26/12 0009 11/26/12 0403 11/26/12 0758 11/26/12 1207  GLUCAP 208* 125* 146* 138* 109*     Micro Results: Recent Results (from the past 240 hour(s))  URINE CULTURE     Status: None   Collection Time    11/24/12  4:02 PM      Result Value Range Status   Specimen  Description URINE, CLEAN CATCH   Final   Special Requests NONE   Final   Culture  Setup Time 11/24/2012 16:59   Final   Colony Count NO GROWTH   Final   Culture NO GROWTH   Final   Report Status 11/25/2012 FINAL   Final  MRSA PCR SCREENING     Status: None   Collection Time    11/24/12  6:46 PM      Result Value Range Status   MRSA by PCR NEGATIVE  NEGATIVE Final   Comment:            The GeneXpert MRSA Assay (FDA     approved for NASAL specimens     only), is one component of a     comprehensive MRSA colonization     surveillance program. It is not     intended to diagnose MRSA     infection nor to guide or     monitor treatment for     MRSA infections.    Studies/Results: No results found.  Medications: Scheduled Meds: . aspirin EC  81 mg Oral Daily  . atenolol  50 mg Oral Daily  . atorvastatin  80 mg Oral QPM  . carbidopa-levodopa  1 tablet Oral TID  . dextrose  1 ampule Intravenous Once  . donepezil  5 mg Oral Q1400  . feeding supplement  237 mL Oral BID BM  . Flexpen Starter Kit  1 kit Other Once  . heparin  5,000 Units Subcutaneous Q8H  . influenza  inactive virus vaccine  0.5 mL Intramuscular Tomorrow-1000  . insulin aspart  0-5 Units Subcutaneous QHS  . insulin aspart  1-5 Units Subcutaneous TID WC  . losartan  100 mg Oral Q1400  . pneumococcal 23 valent vaccine  0.5 mL Intramuscular Tomorrow-1000  . sodium chloride  3 mL Intravenous Q12H      LOS: 2 days   RAI,RIPUDEEP M.D. [facility] 11/26/2012, 1:17 PM Pager: [phone]  If 7PM-7AM, please contact night-coverage [email or web address] Password [email or web address]

## 2012-11-26 NOTE — Evaluation (Signed)
Occupational Therapy Evaluation Patient Details Name: Kristie Ellison MRN: [id number] DOB: 04-29-1946 Today's Date: 11/26/2012 Time: 8657-8469 OT Time Calculation (min): 35 min  OT Assessment / Plan / Recommendation Clinical Impression   This 67 y.o. Female with h/o dementia and Parkinson's admitted for hypoglycemia.  Pt demonstrates impaired balance, impaired cognition (likely baseline) that impact pt's ability to perform BADLs.  Pt. Will benefit from continued OT to maximize safety and independence due to the below listed deficits.  Pt's spouse reports that dtr works during the day, and he is home with pt during that time; however, many times, pt will not allow spouse to assist, or touch her.  Currently, pt requires min A for all mobility and will need someone physically assisting with this initially.  If family unable to provide min A level 24 hour assist, pt may need to go to SNF to improve balance before returning home.     OT Assessment  Patient needs continued OT Services    Follow Up Recommendations  Home health OT;Supervision/Assistance - 24 hour (vs SNF)    Barriers to Discharge Decreased caregiver support husband is available while dtr works, but pt at times, will not allow him to assist her  Equipment Recommendations  3 in 1 bedside comode    Recommendations for Other Services    Frequency  Min 2X/week    Precautions / Restrictions Precautions Precautions: Fall       ADL  Eating/Feeding: Set up Where Assessed - Eating/Feeding: Chair Grooming: Minimal assistance;Teeth care Where Assessed - Grooming: Supported standing Upper Body Bathing: Moderate assistance Where Assessed - Upper Body Bathing: Unsupported sitting;Supported sitting Lower Body Bathing: Moderate assistance Where Assessed - Lower Body Bathing: Supported sit to stand Upper Body Dressing: Moderate assistance Where Assessed - Upper Body Dressing: Unsupported sitting Lower Body Dressing: Moderate  assistance Where Assessed - Lower Body Dressing: Supported sit to Pharmacist, hospital: Minimal assistance Statistician Method: Sit to Barista: Comfort height toilet Toileting - Clothing Manipulation and Hygiene: Minimal assistance Where Assessed - Glass blower/designer Manipulation and Hygiene: Standing Transfers/Ambulation Related to ADLs: Pt with shuffling gait and requires increaed time to initiate ADL Comments: Pt. with decreased thoroughness, cues for sequencing and organization    OT Diagnosis: Generalized weakness;Cognitive deficits;Apraxia  OT Problem List: Decreased strength;Impaired balance (sitting and/or standing);Decreased coordination;Decreased cognition;Decreased safety awareness;Decreased knowledge of use of DME or AE OT Treatment Interventions: Self-care/ADL training;DME and/or AE instruction;Patient/family education;Balance training   OT Goals Acute Rehab OT Goals OT Goal Formulation: With patient/family Time For Goal Achievement: 12/03/12 Potential to Achieve Goals: Good ADL Goals Pt Will Perform Grooming: with supervision;Standing at sink ADL Goal: Grooming - Progress: Goal set today Pt Will Transfer to Toilet: with supervision;Ambulation;Comfort height toilet ADL Goal: Toilet Transfer - Progress: Goal set today Pt Will Perform Toileting - Clothing Manipulation: with supervision;Standing ADL Goal: Toileting - Clothing Manipulation - Progress: Goal set today  Visit Information  Last OT Received On: 11/26/12 Assistance Needed: +1    Subjective Data  Subjective: "Oh, I don't believe you.  I discharged yesterday"  in reference to being at hospital Patient Stated Goal: To go home   Prior Functioning     Home Living Lives With: Spouse;Daughter;Family Available Help at Discharge: Family;Available 24 hours/day Type of Home: House Home Access: Stairs to enter [employer] of Steps: 1 Home Layout: Two level;Bed/bath  upstairs Alternate Level Stairs-Number of Steps: 13 Bathroom Shower/Tub: Engineer, manufacturing systems: Standard Home Adaptive Equipment: Environmental consultant - standard;Straight  cane Additional Comments: Husband present.  He is home with pt during the day, while dtr works, but reports that pt won't let him touch her some days.  Prior Function Level of Independence: Needs assistance Needs Assistance: Bathing;Dressing Able to Take Stairs?: Yes Driving: No Vocation: Retired Comments: Husband reports that dtr assists pt, but is unsure of the amount of assist she provides pt with am ADLs Communication Communication: No difficulties Dominant Hand: Right         Vision/Perception Vision - Assessment Vision Assessment: Vision not tested   Cognition  Cognition Overall Cognitive Status: History of cognitive impairments - at baseline Arousal/Alertness: Awake/alert Orientation Level: Disoriented to;Place;Time;Situation Behavior During Session: WFL for tasks performed Cognition - Other Comments: Pt accusing pt of lying to her, appears paranoid in regards to him at times.  Spouse reports this is the case at home sometimes also    Extremity/Trunk Assessment Right Upper Extremity Assessment RUE ROM/Strength/Tone: Within functional levels RUE Coordination: Deficits RUE Coordination Deficits: rigidity noted, mild tremor Left Upper Extremity Assessment LUE ROM/Strength/Tone: Within functional levels LUE Coordination: Deficits LUE Coordination Deficits: rigidity noted, mild tremor     Mobility Bed Mobility Bed Mobility: Supine to Sit;Sitting - Scoot to [employer] of Bed;Sit to Supine;Scooting to [facility] Supine to Sit: 3: Mod assist;HOB flat Sitting - Scoot to Edge of Bed: 4: Min assist Sit to Supine: 4: Min assist;HOB flat Scooting to HOB: 1: +2 Total assist Scooting to [facility]: Patient Percentage: 30% Details for Bed Mobility Assistance: Pt with significant amount of time to inititate and motor plan movements.   Requires step by step instruction for bed mobility Transfers Transfers: Sit to Stand;Stand to Sit Sit to Stand: 4: Min assist;With upper extremity assist;From bed Stand to Sit: 4: Min assist;With upper extremity assist;To bed Details for Transfer Assistance: min assist to steady pt for balance during transitions     Exercise     Balance     End of Session OT - End of Session Activity Tolerance: Patient tolerated treatment well Patient left: in bed;with call bell/phone within reach;with family/visitor present  GO     Breeley Bischof, Ursula Alert M 11/26/2012, 2:54 PM

## 2012-11-26 NOTE — Progress Notes (Signed)
INITIAL NUTRITION ASSESSMENT  DOCUMENTATION CODES Per approved criteria  -Non-severe (moderate) malnutrition in the context of chronic illness   INTERVENTION: Ensure Complete BID. Each supplement provides 350 kcal and 13 grams of protein.   NUTRITION DIAGNOSIS: Unintentional weight loss related to decreased appetite as evidenced by 23% weight loss over the past year.   Goal: Pt to meet >/= 90% of estimated needs   Monitor:  PO intake, weight trends   Reason for Assessment: Malnutrition Screening Tool (MST=4)  68 y.o. female  Admitting Dx: hypoglycemia    ASSESSMENT: Pt is 67 yo female reporting to ED for hypoglycemia. Pt with hx of Parkinson's dementia, HYT and DM. Pt's daughter primarily manages pt's medication. Pt is on oral diabetes medication and has had recurrent hypotension despite multiple IV dextrose administrations.    Per MD note, pt has lost 40 lbs, likely changing her medication requirements. Pt and family report this loss over the past year. Weight loss started around the time of Parkinson's dx. This 23% weight loss over the past year.  Pt previously with no appetite during this time.   Per family report, pt has been doing better since starting her medication for Parkinson's.  Appetite is back and pt has gained 2-3 lbs in the past month.  Per family, pt and RN, pt with 100% meal completion this morning.   Pt very agreeable to Ensure Complete supplement to help meet her needs during this time.   Pt meets criteria for non-severe malnutrition in the context of chronic illness based on weight loss (23% in less than 1 year) and inadequate energy intake (<75% for > 1 month).  Height: Ht Readings from Last 1 Encounters:  11/24/12 5\' 5"  (1.651 m)    Weight: Wt Readings from Last 1 Encounters:  11/25/12 123 lb 10.9 oz (56.1 kg)    Ideal Body Weight: 125 lbs   % Ideal Body Weight: 98%  Wt Readings from Last 10 Encounters:  11/25/12 123 lb 10.9 oz (56.1 kg)     Usual Body Weight: 160 lbs   % Usual Body Weight: 77%  BMI:  Body mass index is 20.58 kg/(m^2). WNL   Estimated Nutritional Needs: Kcal: 1400-1600 kcal Protein: 65-80 gm  Fluid: >1.6 L   Skin: intact   Diet Order: General  EDUCATION NEEDS: -No education needs identified at this time   Intake/Output Summary (Last 24 hours) at 11/26/12 0817 Last data filed at 11/25/12 1800  Gross per 24 hour  Intake    883 ml  Output    200 ml  Net    683 ml    Last BM: 11/26/2012   Labs:   Recent Labs Lab 11/24/12 1613 11/24/12 1941 11/25/12 0330  NA 143  --  141  K 2.9*  --  3.2*  CL 107  --  109  CO2  --   --  22  BUN 24*  --  17  CREATININE 1.20* 1.07 1.00  CALCIUM  --   --  8.9  MG  --   --  1.7  GLUCOSE 57*  --  111*    CBG (last 3)   Recent Labs  11/26/12 0009 11/26/12 0403 11/26/12 0758  GLUCAP 125* 146* 138*    Scheduled Meds: . aspirin EC  81 mg Oral Daily  . atenolol  50 mg Oral Daily  . atorvastatin  80 mg Oral QPM  . carbidopa-levodopa  1 tablet Oral TID  . dextrose  1 ampule Intravenous Once  .  donepezil  5 mg Oral Q1400  . heparin  5,000 Units Subcutaneous Q8H  . influenza  inactive virus vaccine  0.5 mL Intramuscular Tomorrow-1000  . losartan  100 mg Oral Q1400  . pneumococcal 23 valent vaccine  0.5 mL Intramuscular Tomorrow-1000  . sodium chloride  3 mL Intravenous Q12H    Continuous Infusions: . dextrose 50 mL (11/25/12 1044)    Past Medical History  Diagnosis Date  . Hypertension   . Dementia   . Parkinson disease   . Diabetes mellitus without complication     type 2  . Hypoglycemia 11/2012    History reviewed. No pertinent past surgical history.  Belenda Cruise  Dietetic Intern Pager: [phone]

## 2012-11-26 NOTE — Progress Notes (Signed)
Pt refused flu and pneumonia vaccine.

## 2012-11-26 NOTE — Progress Notes (Signed)
Inpatient Diabetes Program Recommendations  AACE/ADA: New Consensus Statement on Inpatient Glycemic Control (2013)  Target Ranges:  Prepandial:   less than 140 mg/dL      Peak postprandial:   less than 180 mg/dL (1-2 hours)      Critically ill patients:  140 - 180 mg/dL    Spoke with Dr. Isidoro Donning.  Dr. Isidoro Donning would like for patient to d/c home on Novolog Flexpen SSI.  Educated patient and daughter on insulin pen use at home.  Reviewed contents of insulin flexpen starter kit.  Reviewed all steps if insulin pen including attachment of needle, 2-unit air shot, dialing up dose, giving injection, removing needle, disposal of sharps, storage of unused insulin, disposal of insulin etc.  Patient very sleepy throughout while process.  Daughter able to provide successful return demonstration.  Daughter states she will be giving patient her insulin injections.  Also reviewed troubleshooting with insulin pen.  MD to give patient Rxs for insulin pens and insulin pen needles.  Also educated patient's daughter on how to use SSI at home.  Reminded daughter to check CBG before meals and give insulin based on CBG reading per SSI that she is given for home use.  Daughter stated she understood and would be able to do this.  MD- Please make sure to give patient the following separate Rx: 1. Novolog Flexpen (use custom scale that was entered into EPIC on 03/21) 2. Insulin pen needles  Will follow. Ambrose Finland RN, MSN, CDE Diabetes Coordinator Inpatient Diabetes Program [phone]

## 2012-11-26 NOTE — Care Management Note (Signed)
  Page 2 of 2   11/26/2012     3:20:41 PM   CARE MANAGEMENT NOTE 11/26/2012  Patient:  TIERA, MENSINGER   Account Number:  [id number]  Date Initiated:  11/26/2012  Documentation initiated by:  Ronny Flurry  Subjective/Objective Assessment:     Action/Plan:   Anticipated DC Date:  11/26/2012   Anticipated DC Plan:  HOME W HOME HEALTH SERVICES         Choice offered to / List presented to:  C-4 Adult Children   DME arranged  3-N-1        HH arranged  HH-1 RN  HH-2 PT  HH-3 OT  HH-4 NURSE'S AIDE      HH agency  [facility]   Status of service:   Medicare Important Message given?   (If response is "NO", the following Medicare IM given date fields will be blank) Date Medicare IM given:   Date Additional Medicare IM given:    Discharge Disposition:    Per UR Regulation:  Reviewed for med. necessity/level of care/duration of stay  If discussed at Long Length of Stay Meetings, dates discussed:    Comments:  She has an appointment with Dr. Maxwell Caul as new patient on 12/07/12 at 10:00 am .  11-26-12 Corrie Dandy with [facility] returned phone call.  She found that patient's insurance policy is usually just for within [place] state , however, she will submit for pre authorization for home health . Patients daughter is aware and " looking  into  " different insurance plans .  If denied , [facility] can take the patient as a self pay .  Planning discharge tomorrow 11-27-12.  Ronny Flurry RN BSN [phone]    11-26-12 [facility] unable to accept insurance . [employer] [place]. [place] checking on insurance . Ronny Flurry RN BSN    11-26-12 Spoke with patient, patient's husband and patient's daughter Cassandra [phone]) at bedside .  Provided list of agencies for home health services , picked [facility] . Referral made.  Face sheet information confirmed .   Ronny Flurry RN BSN [phone]

## 2012-11-26 NOTE — Progress Notes (Signed)
Agree with student dietitian note.  Zorian Gunderman, MS RD LDN Clinical Inpatient Dietitian Pager: [phone] Weekend/After hours pager: [phone]  

## 2012-11-27 MED ORDER — POTASSIUM CHLORIDE CRYS ER 20 MEQ PO TBCR
40.0000 meq | EXTENDED_RELEASE_TABLET | Freq: Once | ORAL | Status: AC
  Administered 2012-11-27: 40 meq via ORAL
  Filled 2012-11-27 (×2): qty 2

## 2012-11-27 MED ORDER — INSULIN ASPART 100 UNIT/ML ~~LOC~~ SOLN: SUBCUTANEOUS | Status: DC

## 2012-11-27 MED ORDER — FREESTYLE SYSTEM KIT: 1.0000 | PACK | Freq: Three times a day (TID) | Status: DC

## 2012-11-27 NOTE — Discharge Summary (Signed)
[facility]                                                                                   Kristie Ellison, is a 67 y.o. female  DOB Jan 08, 1946  MRN [id number].  Admission date:  11/24/2012  Discharge Date:  11/27/2012  Primary MD  Georgann Housekeeper, MD  Admitting Physician  Maretta Bees, MD  Admission Diagnosis  Altered mental status [780.97] Hypoglycemia [251.2] Dementia [294.20] Diabetes mellitus [250.00]  Discharge Diagnosis     Principal Problem:   Hypoglycemia Active Problems:   Altered mental status   Diabetes mellitus type 2, controlled, without complications   HTN (hypertension)   Dementia   Parkinson disease   CKD (chronic kidney disease) stage 2, GFR 60-89 ml/min   Dyslipidemia     Past Medical History  Diagnosis Date  . Hypertension   . Dementia   . Parkinson disease   . Diabetes mellitus without complication     type 2  . Hypoglycemia 11/2012    History reviewed. No pertinent past surgical history.   Recommendations for primary care physician for things to follow:   Please follow BMP and CBGs closely   Discharge Diagnoses:   Principal Problem:   Hypoglycemia Active Problems:   Altered mental status   Diabetes mellitus type 2, controlled, without complications   HTN (hypertension)   Dementia   Parkinson disease   CKD (chronic kidney disease) stage 2, GFR 60-89 ml/min   Dyslipidemia    Discharge Condition: Stable   Diet recommendation: See Discharge Instructions below   Consults      History of present illness and  Hospital Course:     Kindly see H&P for history of present illness and admission details, please review complete Labs, Consult reports and Test reports for all details in brief Kristie Ellison, is a 67 y.o. female, with history of type 2 diabetes mellitus who is on oral hypoglycemics, dementia, recent  weight loss in the last several months was admitted due to decreased mental status arising from  hypoglycemia, hypoglycemia likely resulting from recent weight loss lowering her insulin resistance and oral hypoglycemic demand. Patient's oral hypoglycemic agent was stopped and she was placed on low-dose sliding scale insulin, she and her family have been educated on insulin by diabetes educator, she has been provided with testing supplies, her glycemic control has been stable on the present regimen and no further episodes of hypoglycemia have happened. She will need outpatient workup for her weight loss.   For her history of hypertension, chronic kidney disease stage II her home medications Tenormin and Cozaar will be continued.   We'll continue her statin for dyslipidemia. 4 history of dementia and Parkinson's disease home medications Sinemet and Aricept will be continued unchanged.  Her potassium was slightly low today which has been replaced will request her to see her PCP on coming Monday for lab work.    Today   Subjective:   Khalil Belote today has no headache,no chest abdominal pain,no new weakness tingling or numbness, feels much better wants to go home today.    Objective:   Blood pressure 149/66, pulse 86, temperature 98.6 F (37  C), temperature source Oral, resp. rate 16, height 5\' 5"  (1.651 m), weight 56.11 kg (123 lb 11.2 oz), SpO2 100.00%.  No intake or output data in the 24 hours ending 11/27/12 0928  Exam Awake Alert, Oriented x 2, No new F.N deficits, Normal affect [place].AT,PERRAL Supple Neck,No JVD, No cervical lymphadenopathy appriciated.  Symmetrical Chest wall movement, Good air movement bilaterally, CTAB RRR,No Gallops,Rubs or new Murmurs, No Parasternal Heave +ve B.Sounds, Abd Soft, Non tender, No organomegaly appriciated, No rebound -guarding or rigidity. No Cyanosis, Clubbing or edema, No new Rash or bruise  Data Review   Major procedures and Radiology Reports - PLEASE review detailed and final reports for all details in brief -       No results  found.  Micro Results      Recent Results (from the past 240 hour(s))  URINE CULTURE     Status: None   Collection Time    11/24/12  4:02 PM      Result Value Range Status   Specimen Description URINE, CLEAN CATCH   Final   Special Requests NONE   Final   Culture  Setup Time 11/24/2012 16:59   Final   Colony Count NO GROWTH   Final   Culture NO GROWTH   Final   Report Status 11/25/2012 FINAL   Final  MRSA PCR SCREENING     Status: None   Collection Time    11/24/12  6:46 PM      Result Value Range Status   MRSA by PCR NEGATIVE  NEGATIVE Final   Comment:            The GeneXpert MRSA Assay (FDA     approved for NASAL specimens     only), is one component of a     comprehensive MRSA colonization     surveillance program. It is not     intended to diagnose MRSA     infection nor to guide or     monitor treatment for     MRSA infections.     CBC w Diff:  Lab Results  Component Value Date   WBC 6.0 11/25/2012   HGB 10.0* 11/25/2012   HCT 29.8* 11/25/2012   PLT 158 11/25/2012   LYMPHOPCT 12 11/24/2012   MONOPCT 6 11/24/2012   EOSPCT 1 11/24/2012   BASOPCT 0 11/24/2012    CMP:  Lab Results  Component Value Date   NA 141 11/25/2012   K 3.2* 11/25/2012   CL 109 11/25/2012   CO2 22 11/25/2012   BUN 17 11/25/2012   CREATININE 1.00 11/25/2012   PROT 5.9* 11/25/2012   ALBUMIN 3.1* 11/25/2012   BILITOT 0.3 11/25/2012   ALKPHOS 63 11/25/2012   AST 30 11/25/2012   ALT 5 11/25/2012  .   Discharge Instructions      Follow with Primary MD Georgann Housekeeper, MD in 2 days   Get CBC, CMP, checked 2 days by Primary MD and again as instructed by your Primary MD.   Get Medicines reviewed and adjusted.  Please request your Prim.MD to go over all Hospital Tests and Procedure/Radiological results at the follow up, please get all Hospital records sent to your Prim MD by signing hospital release before you go home.  Activity: As tolerated with Full fall precautions use walker/cane & assistance  as needed  Accuchecks 4 times/day, Once in AM empty stomach and then before each meal. Log in all results and show them to your Prim.MD in 3 days.  If any glucose reading is under 80 or above 300 call your Prim MD immidiately. Follow Low glucose instructions for glucose under 80 as instructed.  Diet:  Heart Haelthy  For Heart failure patients - Check your Weight same time everyday, if you gain over 2 pounds, or you develop in leg swelling, experience more shortness of breath or chest pain, call your Primary MD immediately. Follow Cardiac Low Salt Diet and 1.8 lit/day fluid restriction.  Disposition Home    If you experience worsening of your admission symptoms, develop shortness of breath, life threatening emergency, suicidal or homicidal thoughts you must seek medical attention immediately by calling 911 or calling your MD immediately  if symptoms less severe.  You Must read complete instructions/literature along with all the possible adverse reactions/side effects for all the Medicines you take and that have been prescribed to you. Take any new Medicines after you have completely understood and accpet all the possible adverse reactions/side effects.  Follow-up Information   Follow up with Georgann Housekeeper, MD On 12/07/2012. (please keep your appointment at 10:00am)    Contact information:   [address] [place] [address] [phone]         Discharge Medications     Medication List    STOP taking these medications       glimepiride 4 MG tablet  Commonly known as:  AMARYL      TAKE these medications       aspirin EC 81 MG tablet  Take 81 mg by mouth daily at 2 PM daily at 2 PM.     atenolol 50 MG tablet  Commonly known as:  TENORMIN  Take 50 mg by mouth daily at 2 PM daily at 2 PM.     atorvastatin 80 MG tablet  Commonly known as:  LIPITOR  Take 80 mg by mouth every evening.     carbidopa-levodopa 25-100 MG per tablet  Commonly known as:  SINEMET  IR  Take 1 tablet by mouth 3 (three) times daily.     donepezil 5 MG tablet  Commonly known as:  ARICEPT  Take 5 mg by mouth daily at 2 PM daily at 2 PM.     glucose monitoring kit monitoring kit  1 each by Does not apply route 4 (four) times daily - after meals and at bedtime. 1 month Diabetic Testing Supplies for QAC-QHS accuchecks.     insulin aspart 100 UNIT/ML injection  Commonly known as:  novoLOG  Before each meal 3 times a day, 151-200 - 1 unit, 201-250 - 2 units, 251-300 - 3 units, 301-4 350 - 5 units, > 351 take 7 units and call your MD     losartan 100 MG tablet  Commonly known as:  COZAAR  Take 100 mg by mouth daily at 2 PM daily at 2 PM.     VITAMIN D PO  Take 1 capsule by mouth once a week. sunday           Total Time in preparing paper work, data evaluation and todays exam - 35 minutes  Leroy Sea M.D on 11/27/2012 at 9:28 AM  [facility] Office  [phone]

## 2012-12-29 ENCOUNTER — Ambulatory Visit (INDEPENDENT_AMBULATORY_CARE_PROVIDER_SITE_OTHER): Payer: Medicare Other | Admitting: Diagnostic Neuroimaging

## 2012-12-29 ENCOUNTER — Encounter: Payer: Self-pay | Admitting: Diagnostic Neuroimaging

## 2012-12-29 VITALS — BP 189/78 | HR 59 | Temp 98.5°F | Ht 65.0 in | Wt 123.5 lb

## 2012-12-29 DIAGNOSIS — G3183 Dementia with Lewy bodies: Secondary | ICD-10-CM

## 2012-12-29 DIAGNOSIS — F0280 Dementia in other diseases classified elsewhere without behavioral disturbance: Secondary | ICD-10-CM | POA: Insufficient documentation

## 2012-12-29 MED ORDER — DONEPEZIL HCL 5 MG PO TABS: 10.0000 mg | ORAL_TABLET | Freq: Every day | ORAL | Status: DC

## 2012-12-29 MED ORDER — DONEPEZIL HCL 10 MG PO TABS: 10.0000 mg | ORAL_TABLET | Freq: Every day | ORAL | Status: DC

## 2012-12-29 NOTE — Progress Notes (Signed)
[facility]  PATIENT: Kristie Ellison DOB: Mar 12, 1946  REFERRING CLINICIAN: Hospital / Donette Larry HISTORY FROM: patient and daughter REASON FOR VISIT: new consult   HISTORICAL  CHIEF COMPLAINT:  Chief Complaint  Patient presents with  . Dementia    HISTORY OF PRESENT ILLNESS:   67 year old female with hypertension, diabetes, here for evaluation of dementia fully body.  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 Patient was living in [place] at that time. She started on donepezil and carbidopa levodopa. More recently she has moved to [place] to live with her daughter. Patient has had several episodes of hypoglycemia and altered mental status. Most recent episode was in March 2014.  Since discharge patient is doing fairly well. She continues on donepezil 5 mg daily and carbidopa/levodopa 25/102 times per day. She takes this medication at 7 AM, 1 PM and 7 PM. Now on all fluctuations are wearing off. No significant tremor.  REVIEW OF SYSTEMS: Full 14 system review of systems performed and notable only for chills fatigue trouble swallowing urination problems runny nose feeling of feeling cold memory loss confusion headache weakness difficulty swallowing depression anxiety not to sleep insomnia restless legs.  ALLERGIES: Allergies  Allergen Reactions  . Codeine Anxiety    Pt reports she goes crazy    HOME MEDICATIONS: Outpatient Prescriptions Prior to Visit  Medication Sig Dispense Refill  . aspirin EC 81 MG tablet Take 81 mg by mouth daily at 2 PM daily at 2 PM.      . atenolol (TENORMIN) 50 MG tablet Take 50 mg by mouth daily at 2 PM daily at 2 PM.      . atorvastatin (LIPITOR) 80 MG tablet Take 80 mg by mouth every evening.      . carbidopa-levodopa (SINEMET IR) 25-100 MG per tablet Take 1 tablet by mouth 3 (three) times daily.      Marland Kitchen glucose monitoring kit (FREESTYLE) monitoring kit 1 each by Does not apply route 4 (four)  times daily - after meals and at bedtime. 1 month Diabetic Testing Supplies for QAC-QHS accuchecks.  1 each  1  . insulin aspart (NOVOLOG) 100 UNIT/ML injection Before each meal 3 times a day, 151-200 - 1 unit, 201-250 - 2 units, 251-300 - 3 units, 301-4 350 - 5 units, > 351 take 7 units and call your MD  1 pen  0  . losartan (COZAAR) 100 MG tablet Take 100 mg by mouth daily at 2 PM daily at 2 PM.      . donepezil (ARICEPT) 5 MG tablet Take 5 mg by mouth daily at 2 PM daily at 2 PM.      . Cholecalciferol (VITAMIN D PO) Take 1 capsule by mouth once a week. sunday       No facility-administered medications prior to visit.    PAST MEDICAL HISTORY: Past Medical History  Diagnosis Date  . Hypertension   . Dementia   . Parkinson disease   . Diabetes mellitus without complication     type 2  . Hypoglycemia 11/2012  . Hyperlipemia     PAST SURGICAL HISTORY: No past surgical history on file.  FAMILY HISTORY: No family history on file.  SOCIAL HISTORY:  History   Social History  . Marital Status: Married    Spouse Name: N/A    Number of Children: 4  . Years of Education: 12th   Occupational History  . retired    Social History Main  Topics  . Smoking status: Never Smoker   . Smokeless tobacco: Never Used  . Alcohol Use: No  . Drug Use: No  . Sexually Active: Not on file   Other Topics Concern  . Not on file   Social History Narrative   Pt lives at home with her spouse.   Caffeine Use- 1 cup daily.      PHYSICAL EXAM  Filed Vitals:   12/29/12 1219  BP: 189/78  Pulse: 59  Temp: 98.5 F (36.9 C)  TempSrc: Oral  Height: 5\' 5"  (1.651 m)  Weight: 123 lb 8 oz (56.019 kg)   Body mass index is 20.55 kg/(m^2).  GENERAL EXAM: Patient is in no distress  CARDIOVASCULAR: Regular rate and rhythm, no murmurs, no carotid bruits  NEUROLOGIC: MENTAL STATUS: awake, alert, language fluent, comprehension intact, naming intact; MMSE 16/30. AFT 8. GDS 6. MASKED FACIES.  POSITIVE SNOUT AND MYERSONS. CRANIAL NERVE: no papilledema on fundoscopic exam, pupils equal and reactive to light, visual fields full to confrontation, extraocular muscles intact, no nystagmus, facial sensation and strength symmetric, uvula midline, shoulder shrug symmetric, tongue midline. MOTOR: normal bulk and tone, full strength in the BUE, BLE SENSORY: normal and symmetric to light touch, pinprick, temperature, vibration COORDINATION: finger-nose-finger, fine finger movements normal REFLEXES: deep tendon reflexes BRISK and symmetric GAIT/STATION: narrow based gait; able to walk on toes, heels and tandem; romberg is negative   DIAGNOSTIC DATA (LABS, IMAGING, TESTING) - I reviewed patient records, labs, notes, testing and imaging myself where available.  Lab Results  Component Value Date   WBC 6.0 11/25/2012   HGB 10.0* 11/25/2012   HCT 29.8* 11/25/2012   MCV 84.9 11/25/2012   PLT 158 11/25/2012      Component Value Date/Time   NA 141 11/25/2012 0330   K 3.2* 11/25/2012 0330   CL 109 11/25/2012 0330   CO2 22 11/25/2012 0330   GLUCOSE 111* 11/25/2012 0330   BUN 17 11/25/2012 0330   CREATININE 1.00 11/25/2012 0330   CALCIUM 8.9 11/25/2012 0330   PROT 5.9* 11/25/2012 0330   ALBUMIN 3.1* 11/25/2012 0330   AST 30 11/25/2012 0330   ALT 5 11/25/2012 0330   ALKPHOS 63 11/25/2012 0330   BILITOT 0.3 11/25/2012 0330   GFRNONAA 57* 11/25/2012 0330   GFRAA 66* 11/25/2012 0330   No results found for this basename: CHOL,  HDL,  LDLCALC,  LDLDIRECT,  TRIG,  CHOLHDL   Lab Results  Component Value Date   HGBA1C 5.1 11/24/2012   No results found for this basename: VITAMINB12   No results found for this basename: TSH     ASSESSMENT AND PLAN  67 y.o. year old female  has a past medical history of Hypertension; Dementia; Parkinson disease; Diabetes mellitus without complication; Hypoglycemia (11/2012); and Hyperlipemia. here with dementia with lewy bodies. Moderate dementia (MMSE 15/30). Gait is quite  unstable.   PLAN: 1. Increase donepezil 10 mg daily 2. Use a rolling walker   Orders Placed This Encounter  Procedures  . For home use only DME Walker rolling      Suanne Marker, MD 12/29/2012, 12:50 PM Certified in Neurology, Neurophysiology and Neuroimaging  [facility] [address], [address] [place], [place] [address] [phone]

## 2012-12-29 NOTE — Patient Instructions (Signed)
Increased donepezil to 10 mg at night. He is a rolling walker with brake. Be careful for falls.

## 2013-02-23 ENCOUNTER — Other Ambulatory Visit: Payer: Self-pay | Admitting: Internal Medicine

## 2013-03-23 ENCOUNTER — Other Ambulatory Visit: Payer: Self-pay

## 2013-03-23 MED ORDER — CARBIDOPA-LEVODOPA 25-100 MG PO TABS: 1.0000 | ORAL_TABLET | Freq: Three times a day (TID) | ORAL | Status: DC

## 2013-07-04 ENCOUNTER — Ambulatory Visit (INDEPENDENT_AMBULATORY_CARE_PROVIDER_SITE_OTHER): Payer: Medicare Other | Admitting: Podiatry

## 2013-07-04 ENCOUNTER — Encounter: Payer: Self-pay | Admitting: Podiatry

## 2013-07-04 VITALS — BP 177/87 | HR 52 | Resp 16 | Ht 62.0 in | Wt 120.0 lb

## 2013-07-04 DIAGNOSIS — M79609 Pain in unspecified limb: Secondary | ICD-10-CM

## 2013-07-04 DIAGNOSIS — B351 Tinea unguium: Secondary | ICD-10-CM

## 2013-07-04 NOTE — Progress Notes (Signed)
Patient ID: HATLEY HENEGAR, female   DOB: [birth year], 67 y.o.   MRN: [id number] Subjective: This 67 year old black female presents with her daughter who is requesting debridement of nails and keratoses.   Medical history includes diabetes, dementia hypertension, chronic kidney disease  Objective: Hypertrophic elongated toenails with texture and color changes and palpable tenderness in all nail plates. Plantar hyperkeratoses first and fifth MPJ right and first third and fifth MPJ left noted  Assessment: Mycotic toenails x10 Plantar keratoses x5  Plan: All 10 toenails were debrided back without any bleeding. Plantar keratoses debridement without a bleeding. Reappoint interval is recommended in 3 months.  Darleth Eustache C.Leeanne Deed, DPM

## 2013-07-26 ENCOUNTER — Ambulatory Visit (INDEPENDENT_AMBULATORY_CARE_PROVIDER_SITE_OTHER): Payer: Medicare Other | Admitting: Diagnostic Neuroimaging

## 2013-07-26 ENCOUNTER — Encounter: Payer: Self-pay | Admitting: Diagnostic Neuroimaging

## 2013-07-26 VITALS — BP 185/84 | HR 80 | Temp 98.1°F | Ht 66.0 in | Wt 126.0 lb

## 2013-07-26 DIAGNOSIS — F0280 Dementia in other diseases classified elsewhere without behavioral disturbance: Secondary | ICD-10-CM

## 2013-07-26 NOTE — Progress Notes (Signed)
[facility]  PATIENT: Kristie Ellison DOB: May 15, 1946  REFERRING CLINICIAN:  HISTORY FROM: patient, husband, son REASON FOR VISIT: new consult   HISTORICAL  CHIEF COMPLAINT:  Chief Complaint  Patient presents with  . Follow-up    Dementia #6    HISTORY OF PRESENT ILLNESS:   UPDATE 07/26/13: Since last visit patient's memory remains poor. She's having more nausea, poor appetite, having difficulty bathing and toileting. Patient's daughter was able to attend visit but did send a note which states the following: Patient has to be reminded to put her pants before going to the bathroom. Patient sits in the chair or toilet sideways. Patient is only able to walk 10 minutes and then becomes tired. She has a tendency to reach for objects is of getting up and walking to it. She has started wearing depends adult diapers to bed.  PRIOR HPI (12/29/12): 67 year old female with hypertension, diabetes, here for evaluation of dementia with lewy bodies.  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 Patient was living in [place] at that time. She started on donepezil and carbidopa/levodopa. More recently she has moved to [place] to live with her daughter. Patient has had several episodes of hypoglycemia and altered mental status. Most recent episode was in March 2014.  Since discharge patient is doing fairly well. She continues on donepezil 5 mg daily and carbidopa/levodopa 25/102 times per day. She takes this medication at 7 AM, 1 PM and 7 PM. Now on all fluctuations are wearing off. No significant tremor.  REVIEW OF SYSTEMS: Full 14 system review of systems performed and notable only for: Fevers chills weight loss fatigue swelling in legs urination from 20 no swelling of feeling cold but increased thirst he is increasing memory loss weakness restless legs decreased energy change in appetite.   ALLERGIES: Allergies  Allergen Reactions  .  Codeine Anxiety    Pt reports she goes crazy    HOME MEDICATIONS: Outpatient Prescriptions Prior to Visit  Medication Sig Dispense Refill  . aspirin EC 81 MG tablet Take 81 mg by mouth daily at 2 PM daily at 2 PM.      . atenolol (TENORMIN) 50 MG tablet Take 50 mg by mouth daily at 2 PM daily at 2 PM.      . atorvastatin (LIPITOR) 80 MG tablet Take 80 mg by mouth every evening.      . carbidopa-levodopa (SINEMET IR) 25-100 MG per tablet Take 1 tablet by mouth 3 (three) times daily.  90 tablet  9  . donepezil (ARICEPT) 10 MG tablet Take 1 tablet (10 mg total) by mouth at bedtime.  30 tablet  12  . glucose monitoring kit (FREESTYLE) monitoring kit 1 each by Does not apply route 4 (four) times daily - after meals and at bedtime. 1 month Diabetic Testing Supplies for QAC-QHS accuchecks.  1 each  1  . insulin aspart (NOVOLOG) 100 UNIT/ML injection Before each meal 3 times a day, 151-200 - 1 unit, 201-250 - 2 units, 251-300 - 3 units, 301-4 350 - 5 units, > 351 take 7 units and call your MD  1 pen  0  . losartan (COZAAR) 100 MG tablet Take 100 mg by mouth daily at 2 PM daily at 2 PM.       No facility-administered medications prior to visit.    PAST MEDICAL HISTORY: Past Medical History  Diagnosis Date  . Hypertension   . Dementia   .  Parkinson disease   . Diabetes mellitus without complication     type 2  . Hypoglycemia 11/2012  . Hyperlipemia     PAST SURGICAL HISTORY: No past surgical history on file.  FAMILY HISTORY: Family History  Problem Relation Age of Onset  . Heart disease Mother   . Heart disease Father     SOCIAL HISTORY:  History   Social History  . Marital Status: Married    Spouse Name: N/A    Number of Children: 4  . Years of Education: 12th   Occupational History  . retired    Social History Main Topics  . Smoking status: Never Smoker   . Smokeless tobacco: Never Used  . Alcohol Use: No  . Drug Use: No  . Sexual Activity: No   Other Topics  Concern  . Not on file   Social History Narrative   Pt lives at home with her spouse.   Caffeine Use- 1 cup daily.      PHYSICAL EXAM  Filed Vitals:   07/26/13 1350  BP: 185/84  Pulse: 80  Temp: 98.1 F (36.7 C)  TempSrc: Oral  Height: 5\' 6"  (1.676 m)  Weight: 126 lb (57.153 kg)   Body mass index is 20.35 kg/(m^2).  GENERAL EXAM: Patient is in no distress  CARDIOVASCULAR: Regular rate and rhythm, no murmurs, no carotid bruits  NEUROLOGIC: MENTAL STATUS: awake, alert, language fluent, comprehension intact, naming intact; MMSE 15/30. AFT 5. GDS 11. MASKED FACIES. POSITIVE SNOUT AND MYERSONS. CRANIAL NERVE: pupils equal and reactive to light, visual fields full to confrontation, extraocular muscles intact, no nystagmus, facial sensation and strength symmetric, uvula midline, shoulder shrug symmetric, tongue midline. MOTOR: normal bulk INCREASED TONE THROUGHOUT (PARATONIA), BRADYKINESIA IN BUE. Full strength in the BUE, BLE SENSORY: normal and symmetric to light touch, temperature, vibration COORDINATION: finger-nose-finger, fine finger movements normal REFLEXES: deep tendon reflexes BRISK and symmetric GAIT/STATION: SLOW CAUTIOUS GAIT. STOOPED POSTURE. USES A CANE.   DIAGNOSTIC DATA (LABS, IMAGING, TESTING) - I reviewed patient records, labs, notes, testing and imaging myself where available.  Lab Results  Component Value Date   WBC 6.0 11/25/2012   HGB 10.0* 11/25/2012   HCT 29.8* 11/25/2012   MCV 84.9 11/25/2012   PLT 158 11/25/2012      Component Value Date/Time   NA 141 11/25/2012 0330   K 3.2* 11/25/2012 0330   CL 109 11/25/2012 0330   CO2 22 11/25/2012 0330   GLUCOSE 111* 11/25/2012 0330   BUN 17 11/25/2012 0330   CREATININE 1.00 11/25/2012 0330   CALCIUM 8.9 11/25/2012 0330   PROT 5.9* 11/25/2012 0330   ALBUMIN 3.1* 11/25/2012 0330   AST 30 11/25/2012 0330   ALT 5 11/25/2012 0330   ALKPHOS 63 11/25/2012 0330   BILITOT 0.3 11/25/2012 0330   GFRNONAA 57* 11/25/2012  0330   GFRAA 66* 11/25/2012 0330   No results found for this basename: CHOL,  HDL,  LDLCALC,  LDLDIRECT,  TRIG,  CHOLHDL   Lab Results  Component Value Date   HGBA1C 5.1 11/24/2012   No results found for this basename: VITAMINB12   No results found for this basename: TSH     ASSESSMENT AND PLAN  67 y.o. year old female  has a past medical history of Hypertension; Dementia; Parkinson disease; Diabetes mellitus without complication; Hypoglycemia (11/2012); and Hyperlipemia. here with dementia with lewy bodies. Moderate dementia (MMSE 15/30). Gait is quite unstable. Nausea is problematic.  PLAN: 1. Reduce carb/levodopa due to persistent  nausea 2. May need to reduce donepezil as well if nausea not improved  Return in about 3 months (around 10/26/2013) for with Edison Nasuti, MD 07/26/2013, 3:01 PM Certified in Neurology, Neurophysiology and Neuroimaging  [facility] [address] [place], [place] [address] [phone]

## 2013-07-26 NOTE — Patient Instructions (Signed)
Today her main complaint was nausea and poor appetite. I recommend to reduce carbidopa/levodopa to one tablet in the morning and 1 tablet in the afternoon. Stop the evening dose. After 2-4 weeks, see if her nausea has improved. If appetite and nausea are better than stay on same dose. If appetite and nausea are still bothersome, then reduce carbidopa/levodopa to half a tablet in the morning and half a tablet in the afternoon.  Your weight at this visit is 126 pounds. Last visit in April 2014 weight was 123 pounds.

## 2013-08-02 ENCOUNTER — Telehealth: Payer: Self-pay | Admitting: Diagnostic Neuroimaging

## 2013-08-31 ENCOUNTER — Other Ambulatory Visit: Payer: Self-pay | Admitting: Obstetrics and Gynecology

## 2013-08-31 ENCOUNTER — Other Ambulatory Visit (HOSPITAL_COMMUNITY)
Admission: RE | Admit: 2013-08-31 | Discharge: 2013-08-31 | Disposition: A | Discharge status: Home / Self Care | Payer: Medicare Other | Source: Ambulatory Visit | Attending: Obstetrics and Gynecology | Admitting: Obstetrics and Gynecology

## 2013-08-31 DIAGNOSIS — Z124 Encounter for screening for malignant neoplasm of cervix: Secondary | ICD-10-CM | POA: Insufficient documentation

## 2013-08-31 DIAGNOSIS — Z1151 Encounter for screening for human papillomavirus (HPV): Secondary | ICD-10-CM | POA: Insufficient documentation

## 2013-09-06 ENCOUNTER — Other Ambulatory Visit: Payer: Self-pay | Admitting: Gastroenterology

## 2013-09-06 DIAGNOSIS — R109 Unspecified abdominal pain: Secondary | ICD-10-CM

## 2013-09-06 DIAGNOSIS — D649 Anemia, unspecified: Secondary | ICD-10-CM

## 2013-09-20 ENCOUNTER — Ambulatory Visit
Admission: RE | Admit: 2013-09-20 | Discharge: 2013-09-20 | Disposition: A | Discharge status: Home / Self Care | Payer: Medicare Other | Source: Ambulatory Visit | Attending: Gastroenterology | Admitting: Gastroenterology

## 2013-09-20 DIAGNOSIS — R109 Unspecified abdominal pain: Secondary | ICD-10-CM

## 2013-09-20 DIAGNOSIS — D649 Anemia, unspecified: Secondary | ICD-10-CM

## 2013-09-20 MED ORDER — IOHEXOL 300 MG/ML  SOLN: 100.0000 mL | Freq: Once | INTRAMUSCULAR | Status: DC | PRN

## 2013-09-20 MED ORDER — IOHEXOL 300 MG/ML  SOLN
100.0000 mL | Freq: Once | INTRAMUSCULAR | Status: AC | PRN
  Administered 2013-09-20: 100 mL via INTRAVENOUS

## 2013-10-03 ENCOUNTER — Ambulatory Visit (INDEPENDENT_AMBULATORY_CARE_PROVIDER_SITE_OTHER): Payer: Medicare Other | Admitting: Podiatry

## 2013-10-03 ENCOUNTER — Encounter: Payer: Self-pay | Admitting: Podiatry

## 2013-10-03 VITALS — BP 147/78 | HR 68 | Resp 12

## 2013-10-03 DIAGNOSIS — M79609 Pain in unspecified limb: Secondary | ICD-10-CM

## 2013-10-03 DIAGNOSIS — B351 Tinea unguium: Secondary | ICD-10-CM

## 2013-10-04 NOTE — Progress Notes (Signed)
Patient ID: Kristie Ellison, female   DOB: Jan 26, 1946, 68 y.o.   MRN: [id number]  Subjective: This 68 year old black female presents with her daughter who is requesting debridement of nails and keratoses.  Medical history includes diabetes, dementia hypertension, chronic kidney disease   Objective: Hypertrophic elongated toenails with texture and color changes and palpable tenderness in all nail plates. Plantar hyperkeratoses first and fifth MPJ right and first third and fifth MPJ left noted   Assessment: Mycotic toenails x10  Plantar keratoses x5   Plan: All 10 toenails were debrided back without any bleeding. Plantar keratoses debridement without a bleeding.  Reappoint interval is recommended in 3 months.

## 2013-12-06 ENCOUNTER — Encounter (HOSPITAL_COMMUNITY): Payer: Self-pay | Admitting: Emergency Medicine

## 2013-12-06 DIAGNOSIS — Z79899 Other long term (current) drug therapy: Secondary | ICD-10-CM | POA: Insufficient documentation

## 2013-12-06 DIAGNOSIS — I10 Essential (primary) hypertension: Secondary | ICD-10-CM | POA: Insufficient documentation

## 2013-12-06 DIAGNOSIS — G20 Parkinson's disease: Secondary | ICD-10-CM | POA: Insufficient documentation

## 2013-12-06 DIAGNOSIS — F0390 Unspecified dementia without behavioral disturbance: Secondary | ICD-10-CM | POA: Insufficient documentation

## 2013-12-06 DIAGNOSIS — J3489 Other specified disorders of nose and nasal sinuses: Secondary | ICD-10-CM | POA: Insufficient documentation

## 2013-12-06 DIAGNOSIS — E119 Type 2 diabetes mellitus without complications: Secondary | ICD-10-CM | POA: Insufficient documentation

## 2013-12-06 DIAGNOSIS — Z7982 Long term (current) use of aspirin: Secondary | ICD-10-CM | POA: Insufficient documentation

## 2013-12-06 DIAGNOSIS — R51 Headache: Secondary | ICD-10-CM | POA: Insufficient documentation

## 2013-12-06 DIAGNOSIS — G20A1 Parkinson's disease without dyskinesia, without mention of fluctuations: Secondary | ICD-10-CM | POA: Insufficient documentation

## 2013-12-06 DIAGNOSIS — N390 Urinary tract infection, site not specified: Secondary | ICD-10-CM | POA: Insufficient documentation

## 2013-12-06 DIAGNOSIS — E785 Hyperlipidemia, unspecified: Secondary | ICD-10-CM | POA: Insufficient documentation

## 2013-12-06 NOTE — ED Notes (Addendum)
Pt presents to department for evaluation of headache. Ongoing x3 days. Also reports nausea. 10/10 pain upon arrival. History of dementia and parkinson's disease. Pt is alert and can answer questions appropriately. No neurological deficits noted.

## 2013-12-07 ENCOUNTER — Emergency Department (HOSPITAL_COMMUNITY): Payer: Medicare Other

## 2013-12-07 ENCOUNTER — Encounter (HOSPITAL_COMMUNITY): Payer: Self-pay | Admitting: Radiology

## 2013-12-07 ENCOUNTER — Emergency Department (HOSPITAL_COMMUNITY)
Admission: EM | Admit: 2013-12-07 | Discharge: 2013-12-07 | Disposition: A | Discharge status: Home / Self Care | Payer: Medicare Other | Attending: Emergency Medicine | Admitting: Emergency Medicine

## 2013-12-07 DIAGNOSIS — R519 Headache, unspecified: Secondary | ICD-10-CM

## 2013-12-07 DIAGNOSIS — I10 Essential (primary) hypertension: Secondary | ICD-10-CM

## 2013-12-07 DIAGNOSIS — R51 Headache: Secondary | ICD-10-CM

## 2013-12-07 DIAGNOSIS — N390 Urinary tract infection, site not specified: Secondary | ICD-10-CM

## 2013-12-07 LAB — URINE MICROSCOPIC-ADD ON

## 2013-12-07 LAB — URINALYSIS, ROUTINE W REFLEX MICROSCOPIC
BILIRUBIN URINE: NEGATIVE
Glucose, UA: NEGATIVE mg/dL
Hgb urine dipstick: NEGATIVE
Ketones, ur: NEGATIVE mg/dL
NITRITE: NEGATIVE
Protein, ur: NEGATIVE mg/dL
SPECIFIC GRAVITY, URINE: 1.024 (ref 1.005–1.030)
UROBILINOGEN UA: 0.2 mg/dL (ref 0.0–1.0)
pH: 7.5 (ref 5.0–8.0)

## 2013-12-07 MED ORDER — DIPHENHYDRAMINE HCL 25 MG PO CAPS
25.0000 mg | ORAL_CAPSULE | Freq: Once | ORAL | Status: AC
  Administered 2013-12-07: 25 mg via ORAL
  Filled 2013-12-07: qty 1

## 2013-12-07 MED ORDER — NAPROXEN 500 MG PO TABS: 500.0000 mg | ORAL_TABLET | Freq: Two times a day (BID) | ORAL | Status: DC

## 2013-12-07 MED ORDER — NAPROXEN 250 MG PO TABS
500.0000 mg | ORAL_TABLET | Freq: Once | ORAL | Status: AC
  Administered 2013-12-07: 500 mg via ORAL
  Filled 2013-12-07: qty 2

## 2013-12-07 MED ORDER — SULFAMETHOXAZOLE-TRIMETHOPRIM 800-160 MG PO TABS: 1.0000 | ORAL_TABLET | Freq: Two times a day (BID) | ORAL | Status: DC

## 2013-12-07 MED ORDER — METOCLOPRAMIDE HCL 10 MG PO TABS
10.0000 mg | ORAL_TABLET | Freq: Once | ORAL | Status: AC
  Administered 2013-12-07: 10 mg via ORAL
  Filled 2013-12-07: qty 1

## 2013-12-07 MED ORDER — ZOLPIDEM TARTRATE 5 MG PO TABS: 5.0000 mg | ORAL_TABLET | Freq: Every evening | ORAL | Status: DC | PRN

## 2013-12-07 MED ORDER — SULFAMETHOXAZOLE-TMP DS 800-160 MG PO TABS
1.0000 | ORAL_TABLET | Freq: Once | ORAL | Status: AC
  Administered 2013-12-07: 1 via ORAL
  Filled 2013-12-07: qty 1

## 2013-12-07 NOTE — ED Provider Notes (Signed)
CSN: [id number]     Arrival date & time 12/06/13  2129 History   First MD Initiated Contact with Patient 12/07/13 [phone]     Chief Complaint  Patient presents with  . Headache     (Consider location/radiation/quality/duration/timing/severity/associated sxs/prior Treatment) HPI Comments: [name]. She presents with a complaint of a headache which according to the family member she has been complaining about several times a week for several months. The patient states that it is frontal, it is associated with nasal drainage but no fevers stiff neck cough shortness of breath or chest pain. She has advancing Parkinson's disease, she walks with a cane but has had progressive change with her illness and has some difficulty performing activities of daily living by herself. She lives with family members to do lend assistance as needed and has a health care aide that comes in on the weekend. There has been no fevers, no rashes, no swelling but she does have urinary incontinence that has required her to wear adult undergarments. There has been no seizure activity, she has not had any imaging of her brain since her Parkinson's disease diagnosis several years ago.  Patient is a 68 y.o. female presenting with headaches. The history is provided by the patient and a relative.  Headache   Past Medical History  Diagnosis Date  . Hypertension   . Dementia   . Parkinson disease   . Diabetes mellitus without complication     type 2  . Hypoglycemia 11/2012  . Hyperlipemia    History reviewed. No pertinent past surgical history. Family History  Problem Relation Age of Onset  . Heart disease Mother   . Heart disease Father    History  Substance Use Topics  . Smoking status: Never Smoker   . Smokeless tobacco: Never Used  . Alcohol Use: No   OB History   Grav Para Term Preterm Abortions TAB SAB Ect Mult Living                 Review of  Systems  Unable to perform ROS: Dementia  Neurological: Positive for headaches.      Allergies  Codeine  Home Medications   Current Outpatient Rx  Name  Route  Sig  Dispense  Refill  . aspirin EC 81 MG tablet   Oral   Take 81 mg by mouth daily at 2 PM daily at 2 PM.         . atenolol (TENORMIN) 50 MG tablet   Oral   Take 50 mg by mouth daily.          Marland Kitchen atorvastatin (LIPITOR) 80 MG tablet   Oral   Take 80 mg by mouth every evening.         . carbidopa-levodopa (SINEMET IR) 25-100 MG per tablet   Oral   Take 1 tablet by mouth 2 (two) times daily.         Marland Kitchen donepezil (ARICEPT) 10 MG tablet   Oral   Take 1 tablet (10 mg total) by mouth at bedtime.   30 tablet   12   . ferrous sulfate 325 (65 FE) MG tablet   Oral   Take 325 mg by mouth daily with breakfast.         . losartan (COZAAR) 100 MG tablet   Oral   Take 100 mg by mouth daily.          . naproxen (NAPROSYN) 500 MG tablet  Oral   Take 1 tablet (500 mg total) by mouth 2 (two) times daily with a meal.   30 tablet   0   . sulfamethoxazole-trimethoprim (SEPTRA DS) 800-160 MG per tablet   Oral   Take 1 tablet by mouth every 12 (twelve) hours.   14 tablet   0    BP 179/63  Pulse 73  Temp(Src) 98.3 F (36.8 C) (Oral)  Resp 18  SpO2 99% Physical Exam  Nursing note and vitals reviewed. Constitutional: She appears well-developed and well-nourished. No distress.  HENT:  Head: Normocephalic and atraumatic.  Mouth/Throat: Oropharynx is clear and moist. No oropharyngeal exudate.  No sinus tenderness, oropharynx clear and moist, no nasal discharge or drainage, tympanic membranes clear bilaterally  Eyes: Conjunctivae and EOM are normal. Pupils are equal, round, and reactive to light. Right eye exhibits no discharge. Left eye exhibits no discharge. No scleral icterus.  Neck: Normal range of motion. Neck supple. No JVD present. No thyromegaly present.  Cardiovascular: Normal rate, regular rhythm,  normal heart sounds and intact distal pulses.  Exam reveals no gallop and no friction rub.   No murmur heard. Pulmonary/Chest: Effort normal and breath sounds normal. No respiratory distress. She has no wheezes. She has no rales.  Abdominal: Soft. Bowel sounds are normal. She exhibits no distension and no mass. There is no tenderness.  Musculoskeletal: Normal range of motion. She exhibits no edema and no tenderness.  Lymphadenopathy:    She has no cervical adenopathy.  Neurological: She is alert. Coordination normal.  The patient has generalized stiffness to all extremities and the neck, this is baseline for the patient, moves all extremities x4, normal strength, normal speech, no facial droop area there is some cogwheel rigidity  Skin: Skin is warm and dry. No rash noted. No erythema.  Psychiatric: She has a normal mood and affect. Her behavior is normal.    ED Course  Procedures (including critical care time) Labs Review Labs Reviewed  URINALYSIS, ROUTINE W REFLEX MICROSCOPIC - Abnormal; Notable for the following:    APPearance CLOUDY (*)    Leukocytes, UA SMALL (*)    All other components within normal limits  URINE MICROSCOPIC-ADD ON - Abnormal; Notable for the following:    Squamous Epithelial / LPF FEW (*)    Bacteria, UA MANY (*)    All other components within normal limits  URINE CULTURE   Imaging Review Ct Head Wo Contrast  12/07/2013   CLINICAL DATA:  Headache for 3 days, nausea.  Pain.  Dementia.  EXAM: CT HEAD WITHOUT CONTRAST  TECHNIQUE: Contiguous axial images were obtained from the base of the skull through the vertex without intravenous contrast.  COMPARISON:  None available for comparison at time of study interpretation.  FINDINGS: The ventricles and sulci are normal for age ; trace cavum septum pellucidum is a normal variant. No intraparenchymal hemorrhage, mass effect nor midline shift. Patchy supratentorial white matter hypodensities are within normal range for  patient's age and though non-specific suggest sequelae of chronic small vessel ischemic disease. No acute large vascular territory infarcts.  No abnormal extra-axial fluid collections. Basal cisterns are patent. Mild calcific atherosclerosis of the carotid siphons.  No skull fracture. Visualized paranasal sinuses and mastoid aircells are well-aerated. The included ocular globes and orbital contents are non-suspicious.  IMPRESSION: No acute intracranial process.  Normal noncontrast CT of the head for age: Involutional changes, mild white matter changes may reflect chronic small vessel ischemic disease.   Electronically Signed  By: Awilda Metroourtnay  Bloomer   On: 12/07/2013 01:16     EKG Interpretation None      MDM   Final diagnoses:  Headache  Hypertension  UTI (lower urinary tract infection)    At this time the patient will need evaluation for source of the headache, given her underlying illness of dementia and Parkinson's disease and the progressive nature of her headaches I suspect that she may need evaluation with CT scan of the brain, she will be given pain medications and check a urinalysis. The family also complains of patient is not sleeping well which they think is contributed to her headache. They have discussed this with the family doctor, no medications have been given up to this point, may try low-dose Ambien.  , Urinalysis shows urinary tract infection, urine culture sent.  CT scan unremarkable and reassuring, headache has improved after medications, patient stable for discharge.  Findings discussed with family members who are in agreement with discharge.  Meds given in ED:  Medications  sulfamethoxazole-trimethoprim (BACTRIM DS) 800-160 MG per tablet 1 tablet (not administered)  naproxen (NAPROSYN) tablet 500 mg (500 mg Oral Given 12/07/13 0125)  metoCLOPramide (REGLAN) tablet 10 mg (10 mg Oral Given 12/07/13 0125)  diphenhydrAMINE (BENADRYL) capsule 25 mg (25 mg Oral Given 12/07/13  0125)    New Prescriptions   NAPROXEN (NAPROSYN) 500 MG TABLET    Take 1 tablet (500 mg total) by mouth 2 (two) times daily with a meal.   SULFAMETHOXAZOLE-TRIMETHOPRIM (SEPTRA DS) 800-160 MG PER TABLET    Take 1 tablet by mouth every 12 (twelve) hours.        Vida RollerBrian D Fatiha Guzy, MD 12/07/13 [phone]

## 2013-12-07 NOTE — ED Notes (Signed)
Pt here with family for headache X 3 days. Also reports nausea X 3 days.

## 2013-12-07 NOTE — Discharge Instructions (Signed)
Take your home medicines as before  Add bactrim twice daily which may help with the sinus drainage and will help with the urine infection  Naprosyn twice daily for headache.  Please call your doctor for a followup appointment within 24-48 hours. When you talk to your doctor please let them know that you were seen in the emergency department and have them acquire all of your records so that they can discuss the findings with you and formulate a treatment plan to fully care for your new and ongoing problems.

## 2013-12-08 ENCOUNTER — Ambulatory Visit: Payer: Medicare Other | Admitting: Diagnostic Neuroimaging

## 2013-12-29 ENCOUNTER — Ambulatory Visit (INDEPENDENT_AMBULATORY_CARE_PROVIDER_SITE_OTHER): Payer: Medicare Other | Admitting: Diagnostic Neuroimaging

## 2013-12-29 ENCOUNTER — Encounter: Payer: Self-pay | Admitting: Diagnostic Neuroimaging

## 2013-12-29 VITALS — BP 156/68 | HR 57 | Temp 98.8°F | Ht 62.0 in | Wt 124.0 lb

## 2013-12-29 DIAGNOSIS — G3183 Dementia with Lewy bodies: Principal | ICD-10-CM

## 2013-12-29 DIAGNOSIS — F0280 Dementia in other diseases classified elsewhere without behavioral disturbance: Secondary | ICD-10-CM

## 2013-12-29 NOTE — Patient Instructions (Signed)
Continue current medications. 

## 2013-12-29 NOTE — Progress Notes (Signed)
[facility]  PATIENT: Kristie Ellison DOB: 23-Apr-1946  REFERRING CLINICIAN:  HISTORY FROM: patient, husband, son REASON FOR VISIT: new consult   HISTORICAL  CHIEF COMPLAINT:  Chief Complaint  Patient presents with  . Follow-up    Dementia    HISTORY OF PRESENT ILLNESS:   UPDATE 12/29/13: Memory stable. Balance has worsened. She is more paranoid about her husband's fidelity, but according to family this is unfounded. She can be argumentative at times, but overall she "feels blessed".   UPDATE 07/26/13: Since last visit patient's memory remains poor. She's having more nausea, poor appetite, having difficulty bathing and toileting. Patient's daughter was able to attend visit but did send a note which states the following: Patient has to be reminded to put her pants before going to the bathroom. Patient sits in the chair or toilet sideways. Patient is only able to walk 10 minutes and then becomes tired. She has a tendency to reach for objects is of getting up and walking to it. She has started wearing depends adult diapers to bed.  PRIOR HPI (12/29/12): 68 year old female with hypertension, diabetes, here for evaluation of dementia with lewy bodies.  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 [place] time. She started on donepezil and carbidopa/levodopa. More recently [place] with her [place]. Patient has had several episodes of hypoglycemia and altered mental status. Most recent episode was in March 2014.  Since discharge patient is doing fairly well. She continues on donepezil 5 mg daily and carbidopa/levodopa 25/102 times per day. She takes this medication at 7 AM, 1 PM and 7 PM. Now on all fluctuations are wearing off. No significant tremor.  REVIEW OF SYSTEMS: Full 14 system review of systems performed and notable only for: chills fatigue memory loss weakness restless legs  decreased energy depression anxiety insomnia constipation.   ALLERGIES: Allergies  Allergen Reactions  . Codeine Anxiety    Pt reports she goes crazy    HOME MEDICATIONS: Outpatient Prescriptions Prior to Visit  Medication Sig Dispense Refill  . aspirin EC 81 MG tablet Take 81 mg by mouth daily at 2 PM daily at 2 PM.      . atenolol (TENORMIN) 50 MG tablet Take 50 mg by mouth daily.       Marland Kitchen. atorvastatin (LIPITOR) 80 MG tablet Take 80 mg by mouth every evening.      . carbidopa-levodopa (SINEMET IR) 25-100 MG per tablet Take 1 tablet by mouth 2 (two) times daily.      Marland Kitchen. donepezil (ARICEPT) 10 MG tablet Take 1 tablet (10 mg total) by mouth at bedtime.  30 tablet  12  . losartan (COZAAR) 100 MG tablet Take 100 mg by mouth daily.       Marland Kitchen. zolpidem (AMBIEN) 5 MG tablet Take 1 tablet (5 mg total) by mouth at bedtime as needed for sleep.  7 tablet  0  . ferrous sulfate 325 (65 FE) MG tablet Take 325 mg by mouth daily with breakfast.      . naproxen (NAPROSYN) 500 MG tablet Take 1 tablet (500 mg total) by mouth 2 (two) times daily with a meal.  30 tablet  0  . sulfamethoxazole-trimethoprim (SEPTRA DS) 800-160 MG per tablet Take 1 tablet by mouth every 12 (twelve) hours.  14 tablet  0   No facility-administered medications prior to visit.    PAST MEDICAL  HISTORY: Past Medical History  Diagnosis Date  . Hypertension   . Dementia   . Parkinson disease   . Diabetes mellitus without complication     type 2  . Hypoglycemia 11/2012  . Hyperlipemia     PAST SURGICAL HISTORY: History reviewed. No pertinent past surgical history.  FAMILY HISTORY: Family History  Problem Relation Age of Onset  . Heart disease Mother   . Heart disease Father     SOCIAL HISTORY:  History   Social History  . Marital Status: Married    Spouse Name: Maisie Fus    Number of Children: 4  . Years of Education: 12th   Occupational History  . retired    Social History Main Topics  . Smoking status: Never  Smoker   . Smokeless tobacco: Never Used  . Alcohol Use: No  . Drug Use: No  . Sexual Activity: No   Other Topics Concern  . Not on file   Social History Narrative   Pt lives at home with her spouse.   Caffeine Use- 1 cup daily.      PHYSICAL EXAM  Filed Vitals:   12/29/13 1302  BP: 156/68  Pulse: 57  Temp: 98.8 F (37.1 C)  TempSrc: Oral  Height: 5\' 2"  (1.575 m)  Weight: 124 lb (56.246 kg)   Body mass index is 22.67 kg/(m^2).  GENERAL EXAM: Patient is in no distress  CARDIOVASCULAR: Regular rate and rhythm, no murmurs, no carotid bruits  NEUROLOGIC: MENTAL STATUS: awake, alert, language fluent, comprehension intact, naming intact; MMSE 18/30. AFT 7. GDS 8. MASKED FACIES. POSITIVE SNOUT AND MYERSONS. CRANIAL NERVE: pupils equal and reactive to light, visual fields full to confrontation, extraocular muscles intact, no nystagmus, facial sensation and strength symmetric, uvula midline, shoulder shrug symmetric, tongue midline. MOTOR: normal bulk INCREASED TONE THROUGHOUT (PARATONIA), BRADYKINESIA IN BUE. Full strength in the BUE, BLE SENSORY: normal and symmetric to light touch, temperature, vibration COORDINATION: finger-nose-finger, fine finger movements normal REFLEXES: deep tendon reflexes BRISK and symmetric GAIT/STATION: SLOW CAUTIOUS GAIT. STOOPED POSTURE. USES ROLLING WALKER.   DIAGNOSTIC DATA (LABS, IMAGING, TESTING) - I reviewed patient records, labs, notes, testing and imaging myself where available.  Lab Results  Component Value Date   WBC 6.0 11/25/2012   HGB 10.0* 11/25/2012   HCT 29.8* 11/25/2012   MCV 84.9 11/25/2012   PLT 158 11/25/2012      Component Value Date/Time   NA 141 11/25/2012 0330   K 3.2* 11/25/2012 0330   CL 109 11/25/2012 0330   CO2 22 11/25/2012 0330   GLUCOSE 111* 11/25/2012 0330   BUN 17 11/25/2012 0330   CREATININE 1.00 11/25/2012 0330   CALCIUM 8.9 11/25/2012 0330   PROT 5.9* 11/25/2012 0330   ALBUMIN 3.1* 11/25/2012 0330   AST 30  11/25/2012 0330   ALT 5 11/25/2012 0330   ALKPHOS 63 11/25/2012 0330   BILITOT 0.3 11/25/2012 0330   GFRNONAA 57* 11/25/2012 0330   GFRAA 66* 11/25/2012 0330   No results found for this basename: CHOL,  HDL,  LDLCALC,  LDLDIRECT,  TRIG,  CHOLHDL   Lab Results  Component Value Date   HGBA1C 5.1 11/24/2012   No results found for this basename: VITAMINB12   No results found for this basename: TSH     ASSESSMENT AND PLAN  68 y.o. year old female  has a past medical history of Hypertension; Dementia; Parkinson disease; Diabetes mellitus without complication; Hypoglycemia (11/2012); and Hyperlipemia. here with dementia with lewy bodies. Moderate  dementia. Gait is unstable, but better with rolling walker.  MMSE 07/26/13 - 15/30 12/29/13 - 18/30   PLAN: 1. Continue carb/levodopa and donepezil 2. Wheelchair rx'd for longer distance mobility  Return in about 1 year (around 12/30/2014).    Suanne MarkerVIKRAM R. Raeana Blinn, MD 12/29/2013, 1:46 PM Certified in Neurology, Neurophysiology and Neuroimaging  [facility] [address], [address] [place], [place] [address] [phone]

## 2014-01-02 ENCOUNTER — Other Ambulatory Visit: Payer: Self-pay

## 2014-01-02 ENCOUNTER — Ambulatory Visit: Payer: Medicare Other | Admitting: Podiatry

## 2014-01-02 ENCOUNTER — Encounter: Payer: Self-pay | Admitting: Podiatry

## 2014-01-02 VITALS — BP 133/71 | HR 73 | Resp 17 | Ht 62.0 in | Wt 124.0 lb

## 2014-01-02 DIAGNOSIS — B351 Tinea unguium: Secondary | ICD-10-CM

## 2014-01-02 DIAGNOSIS — M79609 Pain in unspecified limb: Secondary | ICD-10-CM

## 2014-01-02 MED ORDER — DONEPEZIL HCL 10 MG PO TABS
10.0000 mg | ORAL_TABLET | Freq: Every day | ORAL | Status: DC
Start: 2014-01-02 — End: 2014-05-10

## 2014-01-02 MED ORDER — CARBIDOPA-LEVODOPA 25-100 MG PO TABS: 1.0000 | ORAL_TABLET | Freq: Two times a day (BID) | ORAL | Status: AC

## 2014-01-02 NOTE — Progress Notes (Signed)
   Subjective:    Patient ID: Kristie Ellison, female    DOB: 1946/08/21, 68 y.o.   MRN: [id number]  HPI Comments: Pt presents for debridement of 1 - 10 toenails, and callouses.     Review of Systems     Objective:   Physical Exam  This 68 year old black female presents with his daughter.  Objective: Elongated, hypertrophic toenails with texture and color changes x10. Small keratoses plantar right and left noted.          Assessment & Plan:  Assessment: Symptomatic onychomycoses x10 Plantar keratoses that need no active debridement  Plan: Nails x10 are debrided without a bleeding. Reappoint at three-month intervals.

## 2014-02-08 ENCOUNTER — Telehealth: Payer: Self-pay | Admitting: Diagnostic Neuroimaging

## 2014-02-08 ENCOUNTER — Encounter: Payer: Self-pay | Admitting: *Deleted

## 2014-02-08 NOTE — Telephone Encounter (Signed)
Chantelle with [facility] called back.  Cannot use notes.  Would need more information re: pt and her ambulatory status see sheet for medicare qualifications.  Dr. Marjory Lies out will speak next week.

## 2014-02-08 NOTE — Telephone Encounter (Signed)
Patient's daughter calling stating [facility] (fax [phone], phone [phone]) needs office notes stating the need for the wheelchair and also the guidelines for the wheelchair.

## 2014-02-08 NOTE — Telephone Encounter (Addendum)
Faxed over to [place] recommendation for getting light weight wheelchair for pt.   I called and spoke to [place], at [facility], I relayed that ofv note does give information re: information needed to qualify thru medicare for this wheelchair.  I faxed to [facility] at [facility] and she will call back if needs anything more for this.

## 2014-02-08 NOTE — Telephone Encounter (Signed)
Gave to [place] to complete

## 2014-02-08 NOTE — Telephone Encounter (Signed)
See previous note

## 2014-04-04 ENCOUNTER — Telehealth: Payer: Self-pay | Admitting: Diagnostic Neuroimaging

## 2014-04-04 NOTE — Telephone Encounter (Signed)
I called and [facility] to [facility] about getting the medicare recommendations for pts that require wheelchairs.  (light weight wheelchair).  Will  Fax us requirements. [phone].

## 2014-04-04 NOTE — Telephone Encounter (Signed)
Pt's daughter Elonda HuskyCassandra requesting an earlier appointment with Dr. Marjory LiesPenumalli, pts condition has worsened.  Also inquiring about status of wheel [facility] (see previous phone note 6/3).  Cassandra stated can return call anytime.

## 2014-04-05 NOTE — Telephone Encounter (Signed)
Spoke to patient's daughter Kristie Ellison. She states patient's sx(s) have worsen. Sx(s) include: increased gait difficulty, holding saliva in mouth, whining, aggravated and agitated easily, not eating, weight loss. Scheduled appt w/ Dr. Marjory LiesPenumalli next week. Advised of RN-Sandy previous note about wheel chair.

## 2014-04-05 NOTE — Telephone Encounter (Signed)
Returned call in attempt to schedule appt. No answer. Left message for Cassandra.

## 2014-04-05 NOTE — Telephone Encounter (Signed)
Daughter Elonda HuskyCassandra returning call, please retun call [phone] anytime to day.  Can leave message if not available.

## 2014-04-06 ENCOUNTER — Encounter (HOSPITAL_COMMUNITY): Payer: Self-pay | Admitting: Emergency Medicine

## 2014-04-06 DIAGNOSIS — Z792 Long term (current) use of antibiotics: Secondary | ICD-10-CM | POA: Diagnosis not present

## 2014-04-06 DIAGNOSIS — Z7982 Long term (current) use of aspirin: Secondary | ICD-10-CM | POA: Insufficient documentation

## 2014-04-06 DIAGNOSIS — E785 Hyperlipidemia, unspecified: Secondary | ICD-10-CM | POA: Diagnosis not present

## 2014-04-06 DIAGNOSIS — E119 Type 2 diabetes mellitus without complications: Secondary | ICD-10-CM | POA: Diagnosis not present

## 2014-04-06 DIAGNOSIS — R1084 Generalized abdominal pain: Secondary | ICD-10-CM | POA: Insufficient documentation

## 2014-04-06 DIAGNOSIS — N390 Urinary tract infection, site not specified: Secondary | ICD-10-CM | POA: Insufficient documentation

## 2014-04-06 DIAGNOSIS — I10 Essential (primary) hypertension: Secondary | ICD-10-CM | POA: Diagnosis not present

## 2014-04-06 DIAGNOSIS — Z79899 Other long term (current) drug therapy: Secondary | ICD-10-CM | POA: Insufficient documentation

## 2014-04-06 DIAGNOSIS — G20A1 Parkinson's disease without dyskinesia, without mention of fluctuations: Secondary | ICD-10-CM | POA: Insufficient documentation

## 2014-04-06 DIAGNOSIS — F0280 Dementia in other diseases classified elsewhere without behavioral disturbance: Secondary | ICD-10-CM | POA: Diagnosis not present

## 2014-04-06 DIAGNOSIS — G20 Parkinson's disease: Secondary | ICD-10-CM | POA: Insufficient documentation

## 2014-04-06 LAB — URINALYSIS, ROUTINE W REFLEX MICROSCOPIC
Bilirubin Urine: NEGATIVE
GLUCOSE, UA: NEGATIVE mg/dL
HGB URINE DIPSTICK: NEGATIVE
Ketones, ur: 15 mg/dL — AB
Nitrite: NEGATIVE
PH: 7 (ref 5.0–8.0)
Protein, ur: 30 mg/dL — AB
Specific Gravity, Urine: 1.029 (ref 1.005–1.030)
Urobilinogen, UA: 0.2 mg/dL (ref 0.0–1.0)

## 2014-04-06 LAB — CBC
HEMATOCRIT: 34.8 % — AB (ref 36.0–46.0)
HEMOGLOBIN: 11.1 g/dL — AB (ref 12.0–15.0)
MCH: 29.4 pg (ref 26.0–34.0)
MCHC: 31.9 g/dL (ref 30.0–36.0)
MCV: 92.1 fL (ref 78.0–100.0)
Platelets: 177 10*3/uL (ref 150–400)
RBC: 3.78 MIL/uL — ABNORMAL LOW (ref 3.87–5.11)
RDW: 13.2 % (ref 11.5–15.5)
WBC: 4.9 10*3/uL (ref 4.0–10.5)

## 2014-04-06 LAB — COMPREHENSIVE METABOLIC PANEL
ALK PHOS: 65 U/L (ref 39–117)
ALT: 17 U/L (ref 0–35)
AST: 29 U/L (ref 0–37)
Albumin: 3.9 g/dL (ref 3.5–5.2)
Anion gap: 10 (ref 5–15)
BILIRUBIN TOTAL: 0.4 mg/dL (ref 0.3–1.2)
BUN: 29 mg/dL — AB (ref 6–23)
CHLORIDE: 105 meq/L (ref 96–112)
CO2: 30 meq/L (ref 19–32)
Calcium: 9.6 mg/dL (ref 8.4–10.5)
Creatinine, Ser: 1.23 mg/dL — ABNORMAL HIGH (ref 0.50–1.10)
GFR, EST AFRICAN AMERICAN: 51 mL/min — AB (ref >90)
GFR, EST NON AFRICAN AMERICAN: 44 mL/min — AB (ref >90)
GLUCOSE: 154 mg/dL — AB (ref 70–99)
POTASSIUM: 4 meq/L (ref 3.7–5.3)
Sodium: 145 mEq/L (ref 137–147)
Total Protein: 7 g/dL (ref 6.0–8.3)

## 2014-04-06 LAB — URINE MICROSCOPIC-ADD ON

## 2014-04-06 NOTE — ED Notes (Signed)
Assisted pt to restroom twice with using hat to catch urine. Pt was able to urinate the first time but had loose bowel movement in the catch. Pt then was assisted to the restroom again using the hat method to catch urine with this time pt not being able to void. Will continue to try and catch urine specimen.

## 2014-04-06 NOTE — ED Notes (Signed)
Patient here with family. Family reports patient has abdominal pain, urinary frequency, restlessness, and has been "moody". Symptoms began this week. Previous history of UTI with similar symptoms. Patient is intermittently confused at baseline, family reports no change in mentation. A+O x1

## 2014-04-06 NOTE — ED Notes (Signed)
Patient unable to provide urine specimen to this time  Will continue to monitor

## 2014-04-07 ENCOUNTER — Emergency Department (HOSPITAL_COMMUNITY)
Admission: EM | Admit: 2014-04-07 | Discharge: 2014-04-07 | Disposition: A | Discharge status: Home / Self Care | Payer: Medicare Other | Attending: Emergency Medicine | Admitting: Emergency Medicine

## 2014-04-07 ENCOUNTER — Other Ambulatory Visit: Payer: Self-pay | Admitting: Gastroenterology

## 2014-04-07 DIAGNOSIS — R935 Abnormal findings on diagnostic imaging of other abdominal regions, including retroperitoneum: Secondary | ICD-10-CM

## 2014-04-07 DIAGNOSIS — N390 Urinary tract infection, site not specified: Secondary | ICD-10-CM

## 2014-04-07 DIAGNOSIS — K769 Liver disease, unspecified: Secondary | ICD-10-CM

## 2014-04-07 MED ORDER — TRAMADOL HCL 50 MG PO TABS: 50.0000 mg | ORAL_TABLET | Freq: Four times a day (QID) | ORAL | Status: DC | PRN

## 2014-04-07 MED ORDER — CEFTRIAXONE SODIUM 1 G IJ SOLR
1.0000 g | Freq: Once | INTRAMUSCULAR | Status: AC
  Administered 2014-04-07: 1 g via INTRAMUSCULAR
  Filled 2014-04-07: qty 10

## 2014-04-07 MED ORDER — LIDOCAINE HCL (PF) 1 % IJ SOLN
INTRAMUSCULAR | Status: AC
  Administered 2014-04-07: 5 mL
  Filled 2014-04-07: qty 5

## 2014-04-07 MED ORDER — CEPHALEXIN 500 MG PO CAPS: 500.0000 mg | ORAL_CAPSULE | Freq: Four times a day (QID) | ORAL | Status: DC

## 2014-04-07 MED ORDER — TRAMADOL HCL 50 MG PO TABS
50.0000 mg | ORAL_TABLET | Freq: Once | ORAL | Status: AC
Start: 2014-04-07 — End: 2014-04-07
  Administered 2014-04-07: 50 mg via ORAL
  Filled 2014-04-07: qty 1

## 2014-04-07 NOTE — Discharge Instructions (Signed)
Return if symptoms are getting worse.  Urinary Tract Infection Urinary tract infections (UTIs) can develop anywhere along your urinary tract. Your urinary tract is your body's drainage system for removing wastes and extra water. Your urinary tract includes two kidneys, two ureters, a bladder, and a urethra. Your kidneys are a pair of bean-shaped organs. Each kidney is about the size of your fist. They are located below your ribs, one on each side of your spine. CAUSES Infections are caused by microbes, which are microscopic organisms, including fungi, viruses, and bacteria. These organisms are so small that they can only be seen through a microscope. Bacteria are the microbes that most commonly cause UTIs. SYMPTOMS  Symptoms of UTIs may vary by age and gender of the patient and by the location of the infection. Symptoms in young women typically include a frequent and intense urge to urinate and a painful, burning feeling in the bladder or urethra during urination. Older women and men are more likely to be tired, shaky, and weak and have muscle aches and abdominal pain. A fever may mean the infection is in your kidneys. Other symptoms of a kidney infection include pain in your back or sides below the ribs, nausea, and vomiting. DIAGNOSIS To diagnose a UTI, your caregiver will ask you about your symptoms. Your caregiver also will ask to provide a urine sample. The urine sample will be tested for bacteria and white blood cells. White blood cells are made by your body to help fight infection. TREATMENT  Typically, UTIs can be treated with medication. Because most UTIs are caused by a bacterial infection, they usually can be treated with the use of antibiotics. The choice of antibiotic and length of treatment depend on your symptoms and the type of bacteria causing your infection. HOME CARE INSTRUCTIONS  If you were prescribed antibiotics, take them exactly as your caregiver instructs you. Finish the  medication even if you feel better after you have only taken some of the medication.  Drink enough water and fluids to keep your urine clear or pale yellow.  Avoid caffeine, tea, and carbonated beverages. They tend to irritate your bladder.  Empty your bladder often. Avoid holding urine for long periods of time.  Empty your bladder before and after sexual intercourse.  After a bowel movement, women should cleanse from front to back. Use each tissue only once. SEEK MEDICAL CARE IF:   You have back pain.  You develop a fever.  Your symptoms do not begin to resolve within 3 days. SEEK IMMEDIATE MEDICAL CARE IF:   You have severe back pain or lower abdominal pain.  You develop chills.  You have nausea or vomiting.  You have continued burning or discomfort with urination. MAKE SURE YOU:   Understand these instructions.  Will watch your condition.  Will get help right away if you are not doing well or get worse. Document Released: 06/04/2005 Document Revised: 02/24/2012 Document Reviewed: 10/03/2011 [facility] Patient Information 2015 [employer] [place]. This information is not intended to replace advice given to you by your health care provider. Make sure you discuss any questions you have with your health care provider.  Tramadol tablets What is this medicine? TRAMADOL (TRA ma dole) is a pain reliever. It is used to treat moderate to severe pain in adults. This medicine may be used for other purposes; ask your health care provider or pharmacist if you have questions. COMMON BRAND NAME(S): Ultram What should I tell my health care provider before I take  this medicine? They need to know if you have any of these conditions: -brain tumor -depression -drug abuse or addiction -head injury -if you frequently drink alcohol containing drinks -kidney disease or trouble passing urine -liver disease -lung disease, asthma, or breathing problems -seizures or epilepsy -suicidal thoughts,  plans, or attempt; a previous suicide attempt by you or a family member -an unusual or allergic reaction to tramadol, codeine, other medicines, foods, dyes, or preservatives -pregnant or trying to get pregnant -breast-feeding How should I use this medicine? Take this medicine by mouth with a full glass of water. Follow the directions on the prescription label. If the medicine upsets your stomach, take it with food or milk. Do not take more medicine than you are told to take. Talk to your pediatrician regarding the use of this medicine in children. Special care may be needed. Overdosage: If you think you have taken too much of this medicine contact a poison control center or emergency room at once. NOTE: This medicine is only for you. Do not share this medicine with others. What if I miss a dose? If you miss a dose, take it as soon as you can. If it is almost time for your next dose, take only that dose. Do not take double or extra doses. What may interact with this medicine? Do not take this medicine with any of the following medications: -MAOIs like Carbex, Eldepryl, Marplan, Nardil, and Parnate This medicine may also interact with the following medications: -alcohol or medicines that contain alcohol -antihistamines -benzodiazepines -bupropion -carbamazepine or oxcarbazepine -clozapine -cyclobenzaprine -digoxin -furazolidone -linezolid -medicines for depression, anxiety, or psychotic disturbances -medicines for migraine headache like almotriptan, eletriptan, frovatriptan, naratriptan, rizatriptan, sumatriptan, zolmitriptan -medicines for pain like pentazocine, buprenorphine, butorphanol, meperidine, nalbuphine, and propoxyphene -medicines for sleep -muscle relaxants -naltrexone -phenobarbital -phenothiazines like perphenazine, thioridazine, chlorpromazine, mesoridazine, fluphenazine, prochlorperazine, promazine, and trifluoperazine -procarbazine -warfarin This list may not  describe all possible interactions. Give your health care provider a list of all the medicines, herbs, non-prescription drugs, or dietary supplements you use. Also tell them if you smoke, drink alcohol, or use illegal drugs. Some items may interact with your medicine. What should I watch for while using this medicine? Tell your doctor or health care professional if your pain does not go away, if it gets worse, or if you have new or a different type of pain. You may develop tolerance to the medicine. Tolerance means that you will need a higher dose of the medicine for pain relief. Tolerance is normal and is expected if you take this medicine for a long time. Do not suddenly stop taking your medicine because you may develop a severe reaction. Your body becomes used to the medicine. This does NOT mean you are addicted. Addiction is a behavior related to getting and using a drug for a non-medical reason. If you have pain, you have a medical reason to take pain medicine. Your doctor will tell you how much medicine to take. If your doctor wants you to stop the medicine, the dose will be slowly lowered over time to avoid any side effects. You may get drowsy or dizzy. Do not drive, use machinery, or do anything that needs mental alertness until you know how this medicine affects you. Do not stand or sit up quickly, especially if you are an older patient. This reduces the risk of dizzy or fainting spells. Alcohol can increase or decrease the effects of this medicine. Avoid alcoholic drinks. You may have constipation. Try to  have a bowel movement at least every 2 to 3 days. If you do not have a bowel movement for 3 days, call your doctor or health care professional. Your mouth may get dry. Chewing sugarless gum or sucking hard candy, and drinking plenty of water may help. Contact your doctor if the problem does not go away or is severe. What side effects may I notice from receiving this medicine? Side effects that you  should report to your doctor or health care professional as soon as possible: -allergic reactions like skin rash, itching or hives, swelling of the face, lips, or tongue -breathing difficulties, wheezing -confusion -itching -light headedness or fainting spells -redness, blistering, peeling or loosening of the skin, including inside the mouth -seizures Side effects that usually do not require medical attention (report to your doctor or health care professional if they continue or are bothersome): -constipation -dizziness -drowsiness -headache -nausea, vomiting This list may not describe all possible side effects. Call your doctor for medical advice about side effects. You may report side effects to FDA at 1-800-FDA-1088. Where should I keep my medicine? Keep out of the reach of children. Store at room temperature between 15 and 30 degrees C (59 and 86 degrees F). Keep container tightly closed. Throw away any unused medicine after the expiration date. NOTE: This sheet is a summary. It may not cover all possible information. If you have questions about this medicine, talk to your doctor, pharmacist, or health care provider.  2015, Elsevier/Gold Standard. (2010-05-08 11:55:44)  Cephalexin tablets or capsules What is this medicine? CEPHALEXIN (sef a LEX in) is a cephalosporin antibiotic. It is used to treat certain kinds of bacterial infections It will not work for colds, flu, or other viral infections. This medicine may be used for other purposes; ask your health care provider or pharmacist if you have questions. COMMON BRAND NAME(S): Biocef, Keflex, Keftab What should I tell my health care provider before I take this medicine? They need to know if you have any of these conditions: -kidney disease -stomach or intestine problems, especially colitis -an unusual or allergic reaction to cephalexin, other cephalosporins, penicillins, other antibiotics, medicines, foods, dyes or  preservatives -pregnant or trying to get pregnant -breast-feeding How should I use this medicine? Take this medicine by mouth with a full glass of water. Follow the directions on the prescription label. This medicine can be taken with or without food. Take your medicine at regular intervals. Do not take your medicine more often than directed. Take all of your medicine as directed even if you think you are better. Do not skip doses or stop your medicine early. Talk to your pediatrician regarding the use of this medicine in children. While this drug may be prescribed for selected conditions, precautions do apply. Overdosage: If you think you have taken too much of this medicine contact a poison control center or emergency room at once. NOTE: This medicine is only for you. Do not share this medicine with others. What if I miss a dose? If you miss a dose, take it as soon as you can. If it is almost time for your next dose, take only that dose. Do not take double or extra doses. There should be at least 4 to 6 hours between doses. What may interact with this medicine? -probenecid -some other antibiotics This list may not describe all possible interactions. Give your health care provider a list of all the medicines, herbs, non-prescription drugs, or dietary supplements you use. Also tell them  if you smoke, drink alcohol, or use illegal drugs. Some items may interact with your medicine. What should I watch for while using this medicine? Tell your doctor or health care professional if your symptoms do not begin to improve in a few days. Do not treat diarrhea with over the counter products. Contact your doctor if you have diarrhea that lasts more than 2 days or if it is severe and watery. If you have diabetes, you may get a false-positive result for sugar in your urine. Check with your doctor or health care professional. What side effects may I notice from receiving this medicine? Side effects that you  should report to your doctor or health care professional as soon as possible: -allergic reactions like skin rash, itching or hives, swelling of the face, lips, or tongue -breathing problems -pain or trouble passing urine -redness, blistering, peeling or loosening of the skin, including inside the mouth -severe or watery diarrhea -unusually weak or tired -yellowing of the eyes, skin Side effects that usually do not require medical attention (report to your doctor or health care professional if they continue or are bothersome): -gas or heartburn -genital or anal irritation -headache -joint or muscle pain -nausea, vomiting This list may not describe all possible side effects. Call your doctor for medical advice about side effects. You may report side effects to FDA at 1-800-FDA-1088. Where should I keep my medicine? Keep out of the reach of children. Store at room temperature between 59 and 86 degrees F (15 and 30 degrees C). Throw away any unused medicine after the expiration date. NOTE: This sheet is a summary. It may not cover all possible information. If you have questions about this medicine, talk to your doctor, pharmacist, or health care provider.  2015, Elsevier/Gold Standard. (2007-11-29 17:09:13)

## 2014-04-07 NOTE — ED Provider Notes (Signed)
CSN: [id number]     Arrival date & time 04/06/14  2010 History   First MD Initiated Contact with Patient 04/07/14 0057     Chief Complaint  Patient presents with  . Abdominal Pain  . Urinary Frequency     (Consider location/radiation/quality/duration/timing/severity/associated sxs/prior Treatment) Patient is a 68 y.o. female presenting with abdominal pain and frequency. The history is provided by the patient and a relative. The history is limited by the condition of the patient (Dementia).  Abdominal Pain Urinary Frequency Associated symptoms include abdominal pain.  She is complaining of generalized abdominal pain for about the last week. Family has noted urinary frequency and urgency. There has been no nausea vomiting and there's been no fever. She does have history of urinary tract infections. She has history of confusion and family states that mental status has not changed from baseline.  Past Medical History  Diagnosis Date  . Hypertension   . Dementia   . Parkinson disease   . Diabetes mellitus without complication     type 2  . Hypoglycemia 11/2012  . Hyperlipemia    History reviewed. No pertinent past surgical history. Family History  Problem Relation Age of Onset  . Heart disease Mother   . Heart disease Father    History  Substance Use Topics  . Smoking status: Never Smoker   . Smokeless tobacco: Never Used  . Alcohol Use: No   OB History   Grav Para Term Preterm Abortions TAB SAB Ect Mult Living                 Review of Systems  Unable to perform ROS: Dementia  Gastrointestinal: Positive for abdominal pain.  Genitourinary: Positive for frequency.      Allergies  Codeine  Home Medications   Prior to Admission medications   Medication Sig Start Date End Date Taking? Authorizing Provider  aspirin EC 81 MG tablet Take 81 mg by mouth daily at 2 PM daily at 2 PM.    Historical Provider, MD  atenolol (TENORMIN) 50 MG tablet Take 50 mg by mouth daily.      Historical Provider, MD  atorvastatin (LIPITOR) 80 MG tablet Take 80 mg by mouth every evening.    Historical Provider, MD  carbidopa-levodopa (SINEMET IR) 25-100 MG per tablet Take 1 tablet by mouth 2 (two) times daily. 01/02/14   Suanne MarkerVikram R Penumalli, MD  cephALEXin (KEFLEX) 500 MG capsule Take 1 capsule (500 mg total) by mouth 4 (four) times daily. 04/07/14   Dione Boozeavid Chyler Creely, MD  cetirizine (ZYRTEC) 10 MG tablet Take 10 mg by mouth daily.    Historical Provider, MD  ciprofloxacin (CIPRO) 250 MG tablet Take 1 tablet by mouth 2 (two) times daily. 12/28/13   Historical Provider, MD  donepezil (ARICEPT) 10 MG tablet Take 1 tablet (10 mg total) by mouth at bedtime. 01/02/14   Suanne MarkerVikram R Penumalli, MD  losartan (COZAAR) 100 MG tablet Take 100 mg by mouth daily.     Historical Provider, MD  traMADol (ULTRAM) 50 MG tablet Take 1 tablet (50 mg total) by mouth every 6 (six) hours as needed. 04/07/14   Dione Boozeavid Peace Jost, MD  zolpidem (AMBIEN) 5 MG tablet Take 1 tablet (5 mg total) by mouth at bedtime as needed for sleep. 12/07/13   Vida RollerBrian D Miller, MD   BP 119/53  Pulse 76  Temp(Src) 98.1 F (36.7 C) (Oral)  Resp 20  Ht 5' (1.524 m)  Wt 118 lb (53.524 kg)  BMI 23.05 kg/m2  SpO2 100% Physical Exam  Nursing note and vitals reviewed.  68 year old female, resting comfortably and in no acute distress. Vital signs are significant for mild hypertension with blood pressure 143/70. Oxygen saturation is 98%, which is normal. Head is normocephalic and atraumatic. PERRLA, EOMI. Oropharynx is clear. Neck is nontender and supple without adenopathy or JVD. Back is nontender and there is no CVA tenderness. Lungs are clear without rales, wheezes, or rhonchi. Chest is nontender. Heart has regular rate and rhythm without murmur. Abdomen is soft, flat, nontender without masses or hepatosplenomegaly and peristalsis is normoactive. Extremities have no cyanosis or edema, full range of motion is present. Skin is warm and dry without  rash. Neurologic: She is awake and alert but slightly slow to answer questions, cranial nerves are intact, there are no motor or sensory deficits, moderate resting and intention tremor is present.  ED Course  Procedures (including critical care time) Labs Review Results for orders placed during the hospital encounter of 04/07/14  URINALYSIS, ROUTINE W REFLEX MICROSCOPIC      Result Value Ref Range   Color, Urine YELLOW  YELLOW   APPearance CLOUDY (*) CLEAR   Specific Gravity, Urine 1.029  1.005 - 1.030   pH 7.0  5.0 - 8.0   Glucose, UA NEGATIVE  NEGATIVE mg/dL   Hgb urine dipstick NEGATIVE  NEGATIVE   Bilirubin Urine NEGATIVE  NEGATIVE   Ketones, ur 15 (*) NEGATIVE mg/dL   Protein, ur 30 (*) NEGATIVE mg/dL   Urobilinogen, UA 0.2  0.0 - 1.0 mg/dL   Nitrite NEGATIVE  NEGATIVE   Leukocytes, UA LARGE (*) NEGATIVE  CBC      Result Value Ref Range   WBC 4.9  4.0 - 10.5 K/uL   RBC 3.78 (*) 3.87 - 5.11 MIL/uL   Hemoglobin 11.1 (*) 12.0 - 15.0 g/dL   HCT 16.1 (*) 09.6 - 04.5 %   MCV 92.1  78.0 - 100.0 fL   MCH 29.4  26.0 - 34.0 pg   MCHC 31.9  30.0 - 36.0 g/dL   RDW 40.9  81.1 - 91.4 %   Platelets 177  150 - 400 K/uL  COMPREHENSIVE METABOLIC PANEL      Result Value Ref Range   Sodium 145  137 - 147 mEq/L   Potassium 4.0  3.7 - 5.3 mEq/L   Chloride 105  96 - 112 mEq/L   CO2 30  19 - 32 mEq/L   Glucose, Bld 154 (*) 70 - 99 mg/dL   BUN 29 (*) 6 - 23 mg/dL   Creatinine, Ser 7.82 (*) 0.50 - 1.10 mg/dL   Calcium 9.6  8.4 - 95.6 mg/dL   Total Protein 7.0  6.0 - 8.3 g/dL   Albumin 3.9  3.5 - 5.2 g/dL   AST 29  0 - 37 U/L   ALT 17  0 - 35 U/L   Alkaline Phosphatase 65  39 - 117 U/L   Total Bilirubin 0.4  0.3 - 1.2 mg/dL   GFR calc non Af Amer 44 (*) >90 mL/min   GFR calc Af Amer 51 (*) >90 mL/min   Anion gap 10  5 - 15  URINE MICROSCOPIC-ADD ON      Result Value Ref Range   Squamous Epithelial / LPF FEW (*) RARE   WBC, UA 21-50  <3 WBC/hpf   RBC / HPF 0-2  <3 RBC/hpf   Bacteria,  UA MANY (*) RARE   Casts HYALINE CASTS (*) NEGATIVE  Urine-Other MUCOUS PRESENT      MDM   Final diagnoses:  Urinary tract infection without hematuria, site unspecified    Abdominal pain with evidence of urinary tract infection. Urine is sent for culture and she started on antibiotics. She's given an injection of ceftriaxone and discharged with a prescription for cephalexin. She is to followup with PCP in 3 days at which point culture results would be ready.    Dione Booze, MD 04/07/14 [phone]

## 2014-04-08 LAB — URINE CULTURE

## 2014-04-10 ENCOUNTER — Encounter: Payer: Self-pay | Admitting: Podiatry

## 2014-04-10 ENCOUNTER — Ambulatory Visit (INDEPENDENT_AMBULATORY_CARE_PROVIDER_SITE_OTHER): Payer: Medicare Other | Admitting: Podiatry

## 2014-04-10 VITALS — BP 144/79 | HR 76 | Resp 12

## 2014-04-10 DIAGNOSIS — M79673 Pain in unspecified foot: Secondary | ICD-10-CM

## 2014-04-10 DIAGNOSIS — M79609 Pain in unspecified limb: Secondary | ICD-10-CM

## 2014-04-10 DIAGNOSIS — B351 Tinea unguium: Secondary | ICD-10-CM

## 2014-04-10 NOTE — Patient Instructions (Signed)
Purchased heel protector for left foot

## 2014-04-11 ENCOUNTER — Ambulatory Visit (INDEPENDENT_AMBULATORY_CARE_PROVIDER_SITE_OTHER): Payer: Medicare Other | Admitting: Diagnostic Neuroimaging

## 2014-04-11 ENCOUNTER — Encounter: Payer: Self-pay | Admitting: Diagnostic Neuroimaging

## 2014-04-11 VITALS — BP 153/78 | HR 70 | Temp 98.4°F | Ht 60.5 in | Wt 112.6 lb

## 2014-04-11 DIAGNOSIS — G3183 Dementia with Lewy bodies: Principal | ICD-10-CM

## 2014-04-11 DIAGNOSIS — F0280 Dementia in other diseases classified elsewhere without behavioral disturbance: Secondary | ICD-10-CM

## 2014-04-11 MED ORDER — SERTRALINE HCL 25 MG PO TABS
25.0000 mg | ORAL_TABLET | Freq: Every day | ORAL | Status: DC
Start: 2014-04-11 — End: 2014-04-28

## 2014-04-11 NOTE — Progress Notes (Signed)
[facility]  PATIENT: Kristie Ellison DOB: 23-Apr-1946  REFERRING CLINICIAN:  HISTORY FROM: patient and daughter REASON FOR VISIT: follow up   HISTORICAL  CHIEF COMPLAINT:  Chief Complaint  Patient presents with  . Follow-up    dementia    HISTORY OF PRESENT ILLNESS:   UPDATE 04/11/14: Now with more complaints, "whining", feeling down/depressed. Also with agitation, mood swings. Having more stomach pains, burning in stomach. Using a laxative every 2-3 days.   UPDATE 12/29/13: Memory stable. Balance has worsened. She is more paranoid about her husband's fidelity, but according to family this is unfounded. She can be argumentative at times, but overall she "feels blessed".   UPDATE 07/26/13: Since last visit patient's memory remains poor. She's having more nausea, poor appetite, having difficulty bathing and toileting. Patient's daughter was able to attend visit but did send a note which states the following: Patient has to be reminded to put her pants before going to the bathroom. Patient sits in the chair or toilet sideways. Patient is only able to walk 10 minutes and then becomes tired. She has a tendency to reach for objects is of getting up and walking to it. She has started wearing depends adult diapers to bed.  PRIOR HPI (12/29/12): 68 year old female with hypertension, diabetes, here for evaluation of dementia with lewy bodies. 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 Patient was living in [place] at that time. She started on donepezil and carbidopa/levodopa. More recently she has moved to [place] to live with her daughter. Patient has had several episodes of hypoglycemia and altered mental status. Most recent episode was in March 2014. Since discharge patient is doing fairly well. She continues on donepezil 5 mg daily and carbidopa/levodopa 25/102 times per day. She takes this medication at 7 AM, 1 PM and 7 PM. Now on all  fluctuations are wearing off. No significant tremor.  REVIEW OF SYSTEMS: Full 14 system review of systems performed and notable only for: Poor appetite fatigue weight loss insomnia frequent waking constipation urination balance difficulty memory loss speech difficulty agitation depression anxiety hallucination hyperactive behavior problem.   ALLERGIES: Allergies  Allergen Reactions  . Codeine Anxiety    Pt reports she goes crazy    HOME MEDICATIONS: Outpatient Prescriptions Prior to Visit  Medication Sig Dispense Refill  . ALPRAZolam (XANAX) 0.25 MG tablet       . aspirin EC 81 MG tablet Take 81 mg by mouth daily at 2 PM daily at 2 PM.      . atenolol (TENORMIN) 50 MG tablet Take 50 mg by mouth daily.       Marland Kitchen atorvastatin (LIPITOR) 80 MG tablet Take 80 mg by mouth every evening.      . carbidopa-levodopa (SINEMET IR) 25-100 MG per tablet Take 1 tablet by mouth 2 (two) times daily.  60 tablet  12  . cephALEXin (KEFLEX) 500 MG capsule Take 1 capsule (500 mg total) by mouth 4 (four) times daily.  40 capsule  0  . cetirizine (ZYRTEC) 10 MG tablet Take 10 mg by mouth daily.      . ciprofloxacin (CIPRO) 250 MG tablet Take 1 tablet by mouth 2 (two) times daily.      Marland Kitchen donepezil (ARICEPT) 10 MG tablet Take 1 tablet (10 mg total) by mouth at bedtime.  30 tablet  12  . losartan (COZAAR) 100 MG tablet Take 100 mg by mouth daily.       Marland Kitchen  traMADol (ULTRAM) 50 MG tablet Take 1 tablet (50 mg total) by mouth every 6 (six) hours as needed.  15 tablet  0  . traZODone (DESYREL) 50 MG tablet       . zolpidem (AMBIEN) 5 MG tablet Take 1 tablet (5 mg total) by mouth at bedtime as needed for sleep.  7 tablet  0   No facility-administered medications prior to visit.    PAST MEDICAL HISTORY: Past Medical History  Diagnosis Date  . Hypertension   . Dementia   . Parkinson disease   . Diabetes mellitus without complication     type 2  . Hypoglycemia 11/2012  . Hyperlipemia     PAST SURGICAL  HISTORY: History reviewed. No pertinent past surgical history.  FAMILY HISTORY: Family History  Problem Relation Age of Onset  . Heart disease Mother   . Heart disease Father     SOCIAL HISTORY:  History   Social History  . Marital Status: Married    Spouse Name: Maisie Fus    Number of Children: 4  . Years of Education: 12th   Occupational History  . retired    Social History Main Topics  . Smoking status: Never Smoker   . Smokeless tobacco: Never Used  . Alcohol Use: No  . Drug Use: No  . Sexual Activity: No   Other Topics Concern  . Not on file   Social History Narrative   Pt lives at home with her spouse.   Caffeine Use- 1 cup daily.      PHYSICAL EXAM  Filed Vitals:   04/11/14 1040  BP: 153/78  Pulse: 70  Temp: 98.4 F (36.9 C)  TempSrc: Oral  Height: 5' 0.5" (1.537 m)  Weight: 112 lb 9.6 oz (51.075 kg)   Body mass index is 21.62 kg/(m^2).  GENERAL EXAM: Patient is in no distress  CARDIOVASCULAR: Regular rate and rhythm, no murmurs, no carotid bruits  NEUROLOGIC: MENTAL STATUS: awake, alert, language fluent, comprehension intact, naming intact; MMSE 11/30. AFT 5. GDS 8. MASKED FACIES. POSITIVE SNOUT AND MYERSONS. CRANIAL NERVE: pupils equal and reactive to light, visual fields full to confrontation, extraocular muscles intact, no nystagmus, facial sensation and strength symmetric, uvula midline, shoulder shrug symmetric, tongue midline. MOTOR: normal bulk INCREASED TONE THROUGHOUT (PARATONIA), BRADYKINESIA IN BUE. Full strength in the BUE, BLE SENSORY: normal and symmetric to light touch, temperature, vibration COORDINATION: finger-nose-finger, fine finger movements normal REFLEXES: deep tendon reflexes BRISK and symmetric GAIT/STATION: SLOW CAUTIOUS GAIT. STOOPED POSTURE. USING ROLLING WALKER.   DIAGNOSTIC DATA (LABS, IMAGING, TESTING) - I reviewed patient records, labs, notes, testing and imaging myself where available.  Lab Results   Component Value Date   WBC 4.9 04/06/2014   HGB 11.1* 04/06/2014   HCT 34.8* 04/06/2014   MCV 92.1 04/06/2014   PLT 177 04/06/2014      Component Value Date/Time   NA 145 04/06/2014 2033   K 4.0 04/06/2014 2033   CL 105 04/06/2014 2033   CO2 30 04/06/2014 2033   GLUCOSE 154* 04/06/2014 2033   BUN 29* 04/06/2014 2033   CREATININE 1.23* 04/06/2014 2033   CALCIUM 9.6 04/06/2014 2033   PROT 7.0 04/06/2014 2033   ALBUMIN 3.9 04/06/2014 2033   AST 29 04/06/2014 2033   ALT 17 04/06/2014 2033   ALKPHOS 65 04/06/2014 2033   BILITOT 0.4 04/06/2014 2033   GFRNONAA 44* 04/06/2014 2033   GFRAA 51* 04/06/2014 2033   No results found for this basename: CHOL,  HDL,  LDLCALC,  LDLDIRECT,  TRIG,  CHOLHDL   Lab Results  Component Value Date   HGBA1C 5.1 11/24/2012   No results found for this basename: VITAMINB12   No results found for this basename: TSH    12/07/13 CT head - No acute intracranial process. Normal noncontrast CT of the head for age: Involutional changes, mild white matter changes may reflect chronic small vessel ischemic disease.   ASSESSMENT AND PLAN  68 y.o. year old female  has a past medical history of Hypertension; Dementia; Parkinson disease; Diabetes mellitus without complication; Hypoglycemia (11/2012); and Hyperlipemia. here with dementia with lewy bodies. Moderate dementia. More mood instability.  More gait instability.   Patient suffers from dementia with lewy bodies which impairs their ability to perform daily activities like bathing, dressing, feeding, grooming and toileting in the home. A cane, crutch or walker will not resolve  issue with performing activities of daily living. A wheelchair will allow patient to safely perform daily activities. Patient is not able to propel themselves in the home using a standard weight wheelchair due to arm weakness, endurance and general weakness. Patient can self propel in the lightweight wheelchair.  Accessories: elevating leg rests (ELRs), wheel  locks, extensions and anti-tippers.    MMSE 07/26/13 - 15/30 12/29/13 - 18/30  04/11/14 - 11/30  PLAN: 1. Continue carb/levodopa and donepezil 2. Lightweight wheelchair rx'd for longer distance mobility 3. Trial of sertraline for mood/depression  Return in about 6 months (around 10/12/2014).    Suanne MarkerVIKRAM R. PENUMALLI, MD 04/11/2014, 11:47 AM Certified in Neurology, Neurophysiology and Neuroimaging  [facility] [address], [address] [place], [place] [address] [phone]

## 2014-04-11 NOTE — Patient Instructions (Signed)
I will order lightweight wheelchair.   Continue carbidopa/levodopa.  Try sertraline 25mg  daily.

## 2014-04-11 NOTE — Progress Notes (Signed)
Patient ID: Kristie Ellison, female   DOB: [birth year], 68 y.o.   MRN: [id number]  Subjective: This patient presents with daughter complaining of uncomfortable toenails and a callus on the posterior left heel  Objective: Hypertrophic, incurvated, discolored toenails 6-10 Callusing posterior left heel  Assessment: Symptomatic onychomycosis 6-10 Keratoses posterior left heel from prolonged sitting  Plan: Nails x10 debrided down the bleeding  Recommended a left heel protector to be worn when seated for prolonged periods of time and at bedtime  Requiring a three-month intervals

## 2014-04-12 ENCOUNTER — Telehealth: Payer: Self-pay | Admitting: Diagnostic Neuroimaging

## 2014-04-12 NOTE — Telephone Encounter (Signed)
Returned Camera operatorheryl's from [facility] call. Gave verbal order for home health per Dr. Marjory LiesPenumalli.

## 2014-04-12 NOTE — Telephone Encounter (Signed)
Sheryl calling regarding getting an order for patient's home health nursing, physical therapy, and speech therapy. Please return her call and advise.

## 2014-04-18 ENCOUNTER — Other Ambulatory Visit: Payer: Medicare Other

## 2014-04-19 ENCOUNTER — Other Ambulatory Visit: Payer: Medicare Other

## 2014-04-21 ENCOUNTER — Other Ambulatory Visit: Payer: Self-pay | Admitting: Gastroenterology

## 2014-04-21 ENCOUNTER — Telehealth: Payer: Self-pay | Admitting: Diagnostic Neuroimaging

## 2014-04-21 ENCOUNTER — Ambulatory Visit
Admission: RE | Admit: 2014-04-21 | Discharge: 2014-04-21 | Disposition: A | Discharge status: Home / Self Care | Payer: Medicare Other | Source: Ambulatory Visit | Attending: Gastroenterology | Admitting: Gastroenterology

## 2014-04-21 DIAGNOSIS — R935 Abnormal findings on diagnostic imaging of other abdominal regions, including retroperitoneum: Secondary | ICD-10-CM

## 2014-04-21 DIAGNOSIS — K769 Liver disease, unspecified: Secondary | ICD-10-CM

## 2014-04-21 NOTE — Telephone Encounter (Signed)
Ann with [facility] @ [phone], calling for verbal order for 2 x's a week for PT, 1 x a week for gait training, and home exercise program.  Please call and advise.

## 2014-04-24 NOTE — Telephone Encounter (Signed)
Yes, go ahead with PT and exercise program. -VRP

## 2014-04-24 NOTE — Telephone Encounter (Signed)
Called PT Anne @ [facility] and confirmed.

## 2014-04-28 ENCOUNTER — Telehealth: Payer: Self-pay | Admitting: Diagnostic Neuroimaging

## 2014-04-28 NOTE — Telephone Encounter (Signed)
Patient's daughter Elonda HuskyCassandra calling to state that patient has been doing lots of whining and yelling, from the time she wakes up to the time she goes to sleep, and then she states that her chest and heart hurts. Patient's daughter also states that since her last visit, she has had no balance at all. Please return call and advise.

## 2014-04-28 NOTE — Telephone Encounter (Signed)
I called and spoke to [place], daughter of pt.  Pt gets PT and RN that comes out regularly to see pt.   Pt has PD/dementia and has been whining and yelling constant all day long.  Also falling backwards, now cannot walk with walker.  Saw Dr. Eula ListenHussain, after Dr. Marjory LiesPenumalli.  Is now taking Divalproex 125mg  po bid per Dr. Eula ListenHussain.   Updated medications, per daughter.  Some confusion with ambien, trazadone and xanax.  Daughter stated that pt finished 2 ABX for UTI recently.

## 2014-04-28 NOTE — Telephone Encounter (Signed)
Spoke to daughter. Scheduled patient next week w/ NP-Lynn Lam per Dr. Anne HahnWillis note.

## 2014-04-28 NOTE — Telephone Encounter (Signed)
I called patient. The patient has continued to have ongoing agitation, whining. The Zoloft can be increased to 25 mg twice daily. The patient however, has had a change in her functional level, with a tendency to go backwards with standing, with a tendency to fall. This is a new issue, and the patient will need to be reevaluated. I'll try to get her in the office sometime next week.

## 2014-05-03 ENCOUNTER — Ambulatory Visit (INDEPENDENT_AMBULATORY_CARE_PROVIDER_SITE_OTHER): Payer: Medicare Other | Admitting: Nurse Practitioner

## 2014-05-03 ENCOUNTER — Encounter: Payer: Self-pay | Admitting: Nurse Practitioner

## 2014-05-03 VITALS — BP 90/53 | HR 72 | Ht 60.5 in | Wt 106.0 lb

## 2014-05-03 DIAGNOSIS — G3183 Dementia with Lewy bodies: Principal | ICD-10-CM

## 2014-05-03 DIAGNOSIS — R4583 Excessive crying of child, adolescent or adult: Secondary | ICD-10-CM

## 2014-05-03 DIAGNOSIS — IMO0002 Reserved for concepts with insufficient information to code with codable children: Secondary | ICD-10-CM

## 2014-05-03 DIAGNOSIS — R5383 Other fatigue: Secondary | ICD-10-CM

## 2014-05-03 DIAGNOSIS — F0280 Dementia in other diseases classified elsewhere without behavioral disturbance: Secondary | ICD-10-CM

## 2014-05-03 DIAGNOSIS — R5381 Other malaise: Secondary | ICD-10-CM

## 2014-05-03 DIAGNOSIS — R451 Restlessness and agitation: Secondary | ICD-10-CM

## 2014-05-03 MED ORDER — CLONAZEPAM 0.5 MG PO TABS: 0.5000 mg | ORAL_TABLET | Freq: Two times a day (BID) | ORAL | Status: AC | PRN

## 2014-05-03 NOTE — Progress Notes (Signed)
PATIENT: Kristie Ellison DOB: 1945/11/07  REASON FOR VISIT: sooner follow up for dementia with Lewy Bodies HISTORY FROM: patient, daughter, husband  HISTORY OF PRESENT ILLNESS: UPDATE 05/03/14 (LL): Patient's daughter Kristie Ellison calling to state that patient has been doing lots of whining and yelling, from the time she wakes up to the time she goes to sleep. Also complaining of more stomach pain, not eating much, has lost 6 lbs in 3 weeks. Dr. Eula Listen had started her on Divalproex 125 mg bid three weeks ago.  Has also been on Sertraline 25 mg for 3 weeks now.  It is difficult for family members to deal with this. Patient's daughter also states that since her last visit, she has had no balance at all. Very difficult for her to use walker at this point.   UPDATE 04/11/14: Now with more complaints, "whining", feeling down/depressed. Also with agitation, mood swings. Having more stomach pains, burning in stomach. Using a laxative every 2-3 days.  UPDATE 12/29/13: Memory stable. Balance has worsened. She is more paranoid about her husband's fidelity, but according to family this is unfounded. She can be argumentative at times, but overall she "feels blessed".  UPDATE 07/26/13: Since last visit patient's memory remains poor. She's having more nausea, poor appetite, having difficulty bathing and toileting. Patient's daughter was able to attend visit but did send a note which states the following: Patient has to be reminded to put her pants before going to the bathroom. Patient sits in the chair or toilet sideways. Patient is only able to walk 10 minutes and then becomes tired. She has a tendency to reach for objects is of getting up and walking to it. She has started wearing depends adult diapers to bed.  PRIOR HPI (12/29/12): 68 year old female with hypertension, diabetes, here for evaluation of dementia with lewy bodies. 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 Patient was living in  [place] at that time. She started on donepezil and carbidopa/levodopa. More recently she has moved to [place] to live with her daughter. Patient has had several episodes of hypoglycemia and altered  mental status. Most recent episode was in March 2014. Since discharge patient is doing fairly well. She continues on donepezil 5 mg daily and carbidopa/levodopa 25/102 times per day. She takes this medication at 7 AM, 1 PM and 7 PM. Now on all fluctuations are wearing off. No significant tremor.   REVIEW OF SYSTEMS: Full 14 system review of systems performed and notable only for: Poor appetite fatigue weight loss insomnia frequent waking constipation urination balance difficulty memory loss speech difficulty agitation depression anxiety hallucination hyperactive behavior problem.    ALLERGIES: Allergies  Allergen Reactions  . Codeine Anxiety    Pt reports she goes crazy    HOME MEDICATIONS: Outpatient Prescriptions Prior to Visit  Medication Sig Dispense Refill  . ALPRAZolam (XANAX) 0.25 MG tablet       . aspirin EC 81 MG tablet Take 81 mg by mouth daily at 2 PM daily at 2 PM.      . atenolol (TENORMIN) 50 MG tablet Take 50 mg by mouth daily.       Marland Kitchen atorvastatin (LIPITOR) 80 MG tablet Take 80 mg by mouth every evening.      . carbidopa-levodopa (SINEMET IR) 25-100 MG per tablet Take 1 tablet by mouth 2 (two) times daily.  60 tablet  12  . cetirizine (ZYRTEC) 10 MG tablet Take 10 mg by mouth daily. Prn  for allergies      . divalproex (DEPAKOTE) 125 MG DR tablet Take 125 mg by mouth 2 (two) times daily.      Marland Kitchen donepezil (ARICEPT) 10 MG tablet Take 1 tablet (10 mg total) by mouth at bedtime.  30 tablet  12  . losartan (COZAAR) 100 MG tablet Take 100 mg by mouth daily.       . polyethylene glycol (MIRALAX / GLYCOLAX) packet Take 17 g by mouth daily. Twice weekly per daughter.      . sertraline (ZOLOFT) 25 MG tablet Take 50 mg by mouth daily.      . traMADol (ULTRAM) 50 MG tablet Take 1  tablet (50 mg total) by mouth every 6 (six) hours as needed.  15 tablet  0  . traZODone (DESYREL) 50 MG tablet       . zolpidem (AMBIEN) 5 MG tablet Take 1 tablet (5 mg total) by mouth at bedtime as needed for sleep.  7 tablet  0   No facility-administered medications prior to visit.    PHYSICAL EXAM Filed Vitals:   05/03/14 1003  BP: 90/53  Pulse: 72  Height: 5' 0.5" (1.537 m)  Weight: 106 lb (48.081 kg)   Body mass index is 20.35 kg/(m^2).   MMSE - Mini Mental State Exam 04/11/2014  Orientation to time 1  Orientation to Place 0  Registration 3  Attention/ Calculation 0  Recall 1  Language- name 2 objects 2  Language- repeat 0  Language- follow 3 step command 2  Language- read & follow direction 1  Write a sentence 1  Copy design 0  Total score 11   Generalized: Frail elderly AA female, in no acute distress at first; then crying quietly, then loudly, stating "I want to get out of here." Head: normocephalic and atraumatic. Oropharynx benign  Neck: Supple, no carotid bruits  Cardiac: Regular rate rhythm, no murmur  Musculoskeletal: kyphosis   Neurological examination  MENTAL STATUS: awake, alert, language fluent, comprehension intact, naming intact; MASKED FACIES. POSITIVE SNOUT AND MYERSONS.  CRANIAL NERVE: pupils equal and reactive to light, visual fields full to confrontation, extraocular muscles intact, no nystagmus, facial sensation and strength symmetric, uvula midline, shoulder shrug symmetric, tongue midline.  MOTOR: normal bulk INCREASED TONE THROUGHOUT (PARATONIA), BRADYKINESIA IN BUE. Full strength in the BUE, BLE  SENSORY: normal and symmetric to light touch, temperature, vibration  COORDINATION: finger-nose-finger, fine finger movements normal  REFLEXES: deep tendon reflexes BRISK and symmetric  GAIT/STATION: SLOW CAUTIOUS GAIT. STOOPED POSTURE. USING ROLLING WALKER WITH ASSISTANCE.  12/07/13 CT head - No acute intracranial process. Normal noncontrast CT of the  head for age: Involutional changes, mild white matter changes may reflect chronic small vessel ischemic disease.   DIAGNOSTIC DATA (LABS, IMAGING, TESTING) - I reviewed patient records, labs, notes, testing and imaging myself where available.  Lab Results  Component Value Date   WBC 4.9 04/06/2014   HGB 11.1* 04/06/2014   HCT 34.8* 04/06/2014   MCV 92.1 04/06/2014   PLT 177 04/06/2014      Component Value Date/Time   NA 145 04/06/2014 2033   K 4.0 04/06/2014 2033   CL 105 04/06/2014 2033   CO2 30 04/06/2014 2033   GLUCOSE 154* 04/06/2014 2033   BUN 29* 04/06/2014 2033   CREATININE 1.23* 04/06/2014 2033   CALCIUM 9.6 04/06/2014 2033   PROT 7.0 04/06/2014 2033   ALBUMIN 3.9 04/06/2014 2033   AST 29 04/06/2014 2033   ALT 17 04/06/2014 2033  ALKPHOS 65 04/06/2014 2033   BILITOT 0.4 04/06/2014 2033   GFRNONAA 44* 04/06/2014 2033   GFRAA 51* 04/06/2014 2033    ASSESSMENT: 68 y.o. year old female has a past medical history of Hypertension; Parkinson disease; Diabetes mellitus without complication; Hypoglycemia (11/2012); and Hyperlipemia here with dementia with lewy bodies. Moderate dementia. More mood instability. More gait instability.   Patient suffers from dementia with lewy bodies which impairs their ability to perform daily activities like bathing, dressing, feeding, grooming and toileting in the home. A cane, crutch or walker will not resolve issue with performing activities of daily living. A wheelchair will allow patient to safely perform daily activities. Patient is not able to propel themselves in the home using a standard weight wheelchair due to arm weakness, endurance and general weakness. Patient can self propel in the lightweight wheelchair.  Accessories: elevating leg rests (ELRs), wheel locks, extensions and anti-tippers.   MMSE  07/26/13 - 15/30  12/29/13 - 18/30  04/11/14 - 11/30  05/03/14 - unable to complete  PLAN:  Continue carb/levodopa, and trazedone Discontinue donepezil due  to stomach pain, significant weight loss. Must use Lightweight wheelchair rx'd for longer distance mobility. Continue Sertraline for mood/depression, increase to 50  mg daily. Start Clonazepam (Klonopin) 0.5 mg twice daily as needed for anxiety, agitation. Continue Divalproex 125 mg bid (Dr. Eula Listen rx). Discussed community Services that may be able to help with care.  She is not acceptable to adult day care center anymore since she needs "hands-on" care.  Discussed respite care-givers. Keep next scheduled follow up appointment.  Meds ordered this encounter  Medications  . clonazePAM (KLONOPIN) 0.5 MG tablet    Sig: Take 1 tablet (0.5 mg total) by mouth 2 (two) times daily as needed for anxiety.    Dispense:  60 tablet    Refill:  5    Order Specific Question:  Supervising Provider    Answer:  Joycelyn Schmid R [[id number]]   Tawny Asal Aceson Labell, MSN, FNP-BC, A/GNP-C 05/05/2014, 10:01 AM [facility] [address] [place], [place] [address] [phone]  Note: This document was prepared with digital dictation and possible smart phrase technology. Any transcriptional errors that result from this process are unintentional.

## 2014-05-03 NOTE — Patient Instructions (Addendum)
Continue carb/levodopa, trazedone Discontinue donepezil.  Must use Lightweight wheelchair rx'd for mobility.  Continue Sertraline for mood/depression, increase to 50  mg daily Start Clonazepam (Klonopin) 0.5 mg twice daily as needed for anxiety, agitation. Continue Divalproex 125 mg bid (Dr. Eula Listen rx)  Keep next scheduled follow up appointment.  .Community Services That Can Help With Care AARP For older adults who need support to stay independent in their homes, there are a growing number of private and public organizations that offer home and community-based services. Many of these services can help solve long-term care issues and ease the strain on the caregiver. Ranging from bi-weekly help with household chores to round-the-clock in-home care, these services are provided by nurses, trained aides and volunteers. They may take some seeking out, but these services can be well worth the search. Here is some information to help you get started.  [employer] Locator  Get help finding local services in your loved one's community. The [employer] provides area-specific recommendations for services including home care, meal plans, transportation options and more.  MalpracticeAgents.ch.NET/Public/Index.aspx  Companionship Services Local agencies on aging often provide an affordable service whereby older adults are matched with a companion who checks in on a regular basis - both in person and over the phone - to provide home supervision, reassurance and friendly, social interaction. This frequent communication from a companion can help rid caregivers of some of the guilt associated with not being able to stop in as often as they'd like. For information regarding companionship offerings, contact your state's agency on aging.  General Housekeeping and Upkeep For those unable to manage day-to-day tasks such as laundry, cooking, errands and shopping, homemakers and home-care aides can come  in handy. These folks can also provide help with bathing and dressing. For general home upkeep, there are home repair services that will perform minor maintenance and repairs. Both of these providers generally charge an hourly rate. Your state's agency on aging or local senior center may be able to point you in the direction of these services.  Meal Programs Preparing meals on a daily basis can become a stress on older adults. There may come a time when a meal delivery service is needed. Or, if your loved one lives near a senior center or in a facility that arranges group meals, arrangements can be made for him to join a meal group. Find out what meal options are available in your area by calling your local senior center for more information.  Dealer In many communities across the country, senior centers offer older adults the opportunity to gather with their peers in a casual setting and participate in a variety of activities and programs. These centers provide an engaging environment for our older loved ones to age successfully and with fulfillment. Patrons can take part in a variety of activities including exercise classes, day trips and excursions and, in some locations, continuing education classes. Meals are generally offered and health screenings are often available, too. Check your phone book for a senior center near you or visit the [employer] on [employer].  Transportation Whether or not caregivers are nearby to offer transportation for their loved one, there will come a time when alternative transportation options are necessary. There are a variety of transportation services in most communities: taxis, hired IT consultant, volunteer drivers, Dial-a-Ride, ride sharing, public transportation, etc. To learn more about these options, check out our article on transportation alternatives.  Adult Day Services Older adults who require supervised  assistance throughout the day can  visit adult day centers for services in a group setting.   Faith Communities  Some religious organizations offer services of many kinds to older people. If your loved one is already connected to a community, inquire within about their programs for seniors. For example, [employer]  is a nondenominational network of volunteers who help people stay in their homes.  Disease-Specific Organizations For those who suffer from diseases such as Alzheimer's, diabetes or cancer, and their caregivers, disease-specific organizations often provide services that can help both patient and caregiver. The Alzheimer's Association, for example, has a 24-hour helpline where loved ones can turn with questions or if they need someone to talk to. They also offer a 24-hour emergency response service for those suffering from the disease and their families, and an online support community for caregivers. To learn more about the Alzheimer's Association's programs and services, visit their website. The American Cancer Society provides helpful advice on how to be a caregiver to a cancer patient and also offers an online community where cancer caregivers can connect and support one another.

## 2014-05-05 DIAGNOSIS — R4583 Excessive crying of child, adolescent or adult: Secondary | ICD-10-CM | POA: Insufficient documentation

## 2014-05-05 DIAGNOSIS — R5383 Other fatigue: Secondary | ICD-10-CM | POA: Insufficient documentation

## 2014-05-08 NOTE — Progress Notes (Signed)
I reviewed note and agree with plan.   VIKRAM R. PENUMALLI, MD 05/08/2014, 4:53 PM Certified in Neurology, Neurophysiology and Neuroimaging  [facility] [address], [address] [place], [place] [address] [phone]  

## 2014-05-10 ENCOUNTER — Emergency Department (HOSPITAL_COMMUNITY): Payer: Medicare Other

## 2014-05-10 ENCOUNTER — Observation Stay (HOSPITAL_COMMUNITY): Payer: Medicare Other

## 2014-05-10 ENCOUNTER — Encounter (HOSPITAL_COMMUNITY): Payer: Self-pay | Admitting: Emergency Medicine

## 2014-05-10 ENCOUNTER — Inpatient Hospital Stay (HOSPITAL_COMMUNITY)
Admission: EM | Admit: 2014-05-10 | Discharge: 2014-05-13 | DRG: 641 | Disposition: A | Discharge status: Home / Self Care | Payer: Medicare Other | Attending: Internal Medicine | Admitting: Internal Medicine

## 2014-05-10 DIAGNOSIS — E119 Type 2 diabetes mellitus without complications: Secondary | ICD-10-CM | POA: Diagnosis present

## 2014-05-10 DIAGNOSIS — R627 Adult failure to thrive: Secondary | ICD-10-CM | POA: Diagnosis present

## 2014-05-10 DIAGNOSIS — R4181 Age-related cognitive decline: Secondary | ICD-10-CM | POA: Diagnosis present

## 2014-05-10 DIAGNOSIS — E440 Moderate protein-calorie malnutrition: Principal | ICD-10-CM | POA: Insufficient documentation

## 2014-05-10 DIAGNOSIS — R7989 Other specified abnormal findings of blood chemistry: Secondary | ICD-10-CM

## 2014-05-10 DIAGNOSIS — E860 Dehydration: Secondary | ICD-10-CM | POA: Diagnosis not present

## 2014-05-10 DIAGNOSIS — R1313 Dysphagia, pharyngeal phase: Secondary | ICD-10-CM | POA: Diagnosis present

## 2014-05-10 DIAGNOSIS — Z79899 Other long term (current) drug therapy: Secondary | ICD-10-CM

## 2014-05-10 DIAGNOSIS — R5383 Other fatigue: Secondary | ICD-10-CM

## 2014-05-10 DIAGNOSIS — G3183 Dementia with Lewy bodies: Secondary | ICD-10-CM

## 2014-05-10 DIAGNOSIS — E785 Hyperlipidemia, unspecified: Secondary | ICD-10-CM | POA: Diagnosis present

## 2014-05-10 DIAGNOSIS — Z7982 Long term (current) use of aspirin: Secondary | ICD-10-CM

## 2014-05-10 DIAGNOSIS — Z9181 History of falling: Secondary | ICD-10-CM

## 2014-05-10 DIAGNOSIS — N182 Chronic kidney disease, stage 2 (mild): Secondary | ICD-10-CM | POA: Diagnosis present

## 2014-05-10 DIAGNOSIS — I129 Hypertensive chronic kidney disease with stage 1 through stage 4 chronic kidney disease, or unspecified chronic kidney disease: Secondary | ICD-10-CM | POA: Diagnosis present

## 2014-05-10 DIAGNOSIS — R5381 Other malaise: Secondary | ICD-10-CM

## 2014-05-10 DIAGNOSIS — G20 Parkinson's disease: Secondary | ICD-10-CM

## 2014-05-10 DIAGNOSIS — R531 Weakness: Secondary | ICD-10-CM | POA: Diagnosis present

## 2014-05-10 DIAGNOSIS — G40909 Epilepsy, unspecified, not intractable, without status epilepticus: Secondary | ICD-10-CM | POA: Diagnosis present

## 2014-05-10 DIAGNOSIS — F0280 Dementia in other diseases classified elsewhere without behavioral disturbance: Secondary | ICD-10-CM | POA: Diagnosis present

## 2014-05-10 DIAGNOSIS — Z681 Body mass index (BMI) 19 or less, adult: Secondary | ICD-10-CM

## 2014-05-10 LAB — COMPREHENSIVE METABOLIC PANEL
ALK PHOS: 61 U/L (ref 39–117)
ALT: 5 U/L (ref 0–35)
ANION GAP: 10 (ref 5–15)
AST: 25 U/L (ref 0–37)
Albumin: 3.4 g/dL — ABNORMAL LOW (ref 3.5–5.2)
BUN: 26 mg/dL — AB (ref 6–23)
CALCIUM: 9.1 mg/dL (ref 8.4–10.5)
CO2: 27 mEq/L (ref 19–32)
CREATININE: 1.13 mg/dL — AB (ref 0.50–1.10)
Chloride: 103 mEq/L (ref 96–112)
GFR calc non Af Amer: 48 mL/min — ABNORMAL LOW (ref >90)
GFR, EST AFRICAN AMERICAN: 56 mL/min — AB (ref >90)
GLUCOSE: 114 mg/dL — AB (ref 70–99)
POTASSIUM: 3.8 meq/L (ref 3.7–5.3)
Sodium: 140 mEq/L (ref 137–147)
TOTAL PROTEIN: 6.5 g/dL (ref 6.0–8.3)
Total Bilirubin: 0.5 mg/dL (ref 0.3–1.2)

## 2014-05-10 LAB — URINE MICROSCOPIC-ADD ON

## 2014-05-10 LAB — CBC WITH DIFFERENTIAL/PLATELET
BASOS PCT: 1 % (ref 0–1)
Basophils Absolute: 0 10*3/uL (ref 0.0–0.1)
EOS PCT: 1 % (ref 0–5)
Eosinophils Absolute: 0 10*3/uL (ref 0.0–0.7)
HCT: 36.7 % (ref 36.0–46.0)
HEMOGLOBIN: 12 g/dL (ref 12.0–15.0)
LYMPHS ABS: 1.4 10*3/uL (ref 0.7–4.0)
Lymphocytes Relative: 31 % (ref 12–46)
MCH: 29.2 pg (ref 26.0–34.0)
MCHC: 32.7 g/dL (ref 30.0–36.0)
MCV: 89.3 fL (ref 78.0–100.0)
MONO ABS: 0.4 10*3/uL (ref 0.1–1.0)
MONOS PCT: 9 % (ref 3–12)
Neutro Abs: 2.7 10*3/uL (ref 1.7–7.7)
Neutrophils Relative %: 58 % (ref 43–77)
Platelets: 165 10*3/uL (ref 150–400)
RBC: 4.11 MIL/uL (ref 3.87–5.11)
RDW: 13.5 % (ref 11.5–15.5)
WBC: 4.6 10*3/uL (ref 4.0–10.5)

## 2014-05-10 LAB — URINALYSIS, ROUTINE W REFLEX MICROSCOPIC
Glucose, UA: NEGATIVE mg/dL
HGB URINE DIPSTICK: NEGATIVE
Ketones, ur: 15 mg/dL — AB
Leukocytes, UA: NEGATIVE
NITRITE: NEGATIVE
PROTEIN: 30 mg/dL — AB
Specific Gravity, Urine: 1.026 (ref 1.005–1.030)
UROBILINOGEN UA: 1 mg/dL (ref 0.0–1.0)
pH: 6 (ref 5.0–8.0)

## 2014-05-10 LAB — TSH: TSH: 3.03 u[IU]/mL (ref 0.350–4.500)

## 2014-05-10 LAB — I-STAT CG4 LACTIC ACID, ED: LACTIC ACID, VENOUS: 1.14 mmol/L (ref 0.5–2.2)

## 2014-05-10 LAB — CBC
HEMATOCRIT: 36.3 % (ref 36.0–46.0)
Hemoglobin: 12 g/dL (ref 12.0–15.0)
MCH: 29.8 pg (ref 26.0–34.0)
MCHC: 33.1 g/dL (ref 30.0–36.0)
MCV: 90.1 fL (ref 78.0–100.0)
Platelets: 168 10*3/uL (ref 150–400)
RBC: 4.03 MIL/uL (ref 3.87–5.11)
RDW: 13.5 % (ref 11.5–15.5)
WBC: 5.2 10*3/uL (ref 4.0–10.5)

## 2014-05-10 LAB — CREATININE, SERUM
Creatinine, Ser: 0.93 mg/dL (ref 0.50–1.10)
GFR calc Af Amer: 71 mL/min — ABNORMAL LOW (ref >90)
GFR calc non Af Amer: 61 mL/min — ABNORMAL LOW (ref >90)

## 2014-05-10 LAB — PRO B NATRIURETIC PEPTIDE: PRO B NATRI PEPTIDE: 4479 pg/mL — AB (ref 0–125)

## 2014-05-10 LAB — PHOSPHORUS: Phosphorus: 2.6 mg/dL (ref 2.3–4.6)

## 2014-05-10 MED ORDER — SODIUM CHLORIDE 0.9 % IV SOLN
1000.0000 mL | Freq: Once | INTRAVENOUS | Status: AC
  Administered 2014-05-10: 1000 mL via INTRAVENOUS

## 2014-05-10 MED ORDER — ATORVASTATIN CALCIUM 80 MG PO TABS
80.0000 mg | ORAL_TABLET | Freq: Every evening | ORAL | Status: DC
  Administered 2014-05-11: 80 mg via ORAL
  Filled 2014-05-10 (×2): qty 1

## 2014-05-10 MED ORDER — CLONAZEPAM 0.5 MG PO TABS
0.5000 mg | ORAL_TABLET | Freq: Two times a day (BID) | ORAL | Status: DC | PRN
  Administered 2014-05-11 – 2014-05-13 (×3): 0.5 mg via ORAL
  Filled 2014-05-10 (×3): qty 1

## 2014-05-10 MED ORDER — ASPIRIN EC 81 MG PO TBEC
81.0000 mg | DELAYED_RELEASE_TABLET | Freq: Every day | ORAL | Status: DC
  Administered 2014-05-11 – 2014-05-13 (×3): 81 mg via ORAL
  Filled 2014-05-10 (×3): qty 1

## 2014-05-10 MED ORDER — ATENOLOL 25 MG PO TABS
50.0000 mg | ORAL_TABLET | Freq: Every day | ORAL | Status: DC
  Administered 2014-05-11 – 2014-05-13 (×3): 50 mg via ORAL
  Filled 2014-05-10 (×3): qty 2

## 2014-05-10 MED ORDER — TRAZODONE HCL 50 MG PO TABS
50.0000 mg | ORAL_TABLET | Freq: Every day | ORAL | Status: DC
  Administered 2014-05-11 – 2014-05-12 (×2): 50 mg via ORAL
  Filled 2014-05-10 (×2): qty 1

## 2014-05-10 MED ORDER — HEPARIN SODIUM (PORCINE) 5000 UNIT/ML IJ SOLN
5000.0000 [IU] | Freq: Three times a day (TID) | INTRAMUSCULAR | Status: DC
  Administered 2014-05-10 – 2014-05-13 (×8): 5000 [IU] via SUBCUTANEOUS
  Filled 2014-05-10 (×8): qty 1

## 2014-05-10 MED ORDER — WHITE PETROLATUM GEL
Status: AC
  Filled 2014-05-10: qty 5

## 2014-05-10 MED ORDER — SERTRALINE HCL 50 MG PO TABS
50.0000 mg | ORAL_TABLET | Freq: Every day | ORAL | Status: DC
  Administered 2014-05-11 – 2014-05-13 (×3): 50 mg via ORAL
  Filled 2014-05-10 (×3): qty 1

## 2014-05-10 MED ORDER — PNEUMOCOCCAL VAC POLYVALENT 25 MCG/0.5ML IJ INJ
0.5000 mL | INJECTION | INTRAMUSCULAR | Status: AC
  Administered 2014-05-12: 0.5 mL via INTRAMUSCULAR

## 2014-05-10 MED ORDER — VALPROATE SODIUM 500 MG/5ML IV SOLN
125.0000 mg | Freq: Once | INTRAVENOUS | Status: AC
  Administered 2014-05-10: 125 mg via INTRAVENOUS
  Filled 2014-05-10: qty 1.25

## 2014-05-10 MED ORDER — DIVALPROEX SODIUM 125 MG PO DR TAB
125.0000 mg | DELAYED_RELEASE_TABLET | Freq: Two times a day (BID) | ORAL | Status: DC
  Administered 2014-05-11 – 2014-05-13 (×5): 125 mg via ORAL
  Filled 2014-05-10 (×7): qty 1

## 2014-05-10 MED ORDER — CARBIDOPA-LEVODOPA 25-100 MG PO TABS
1.0000 | ORAL_TABLET | Freq: Two times a day (BID) | ORAL | Status: DC
  Administered 2014-05-11 – 2014-05-13 (×5): 1 via ORAL
  Filled 2014-05-10 (×5): qty 1

## 2014-05-10 MED ORDER — SODIUM CHLORIDE 0.9 % IV SOLN
INTRAVENOUS | Status: DC
  Administered 2014-05-10 – 2014-05-11 (×2): via INTRAVENOUS

## 2014-05-10 NOTE — Progress Notes (Signed)
Patient arrived to 4N29 via stretcher, transported by nurse. Vitals stable. Pt in resting comfortably. Alert and oriented to self only. Call bell within reach.

## 2014-05-10 NOTE — H&P (Signed)
Hospitalist Admission History and Physical  Patient name: Kristie Ellison Medical record number: [id number] Date of birth: 05/05/1946 Age: 68 y.o. Gender: female  Primary Care Provider: Georgann Housekeeper, MD  Chief Complaint: weakness, dehydration, failure to thrive  History of Present Illness:This is a 68 y.o. year old female with significant past medical history of end stage dementia and parkinsons disease, type 2 DM, HTN, CKD  presenting with weakness, dehydration, failure to thrive. Family states that pt has had progressive decreased appetite, weight loss, and weakness over the past 2-3 weeks. Has baseline lewy body dementia and parkinsons. Has had general decline over the past 2 years. Was previously only able to minimally ambulate with a rolling walker. However has not been able to ambulate over this time frame. Noted to have been treated for UTI within the past 2 weeks. Has complete abx course. Family states urination frequency has significantly went down. Has had some falls at home. Unsure of head trauma. No reports of seizure activity. Has had general decrease in appetite and fluid intake. No reports of vomiting, diarrhea, SOB. No changes in medication. Was evaluated by home PT today with noted BP in 90s which is well off of baseline and was subsequently brought to the ER for further evaluation.  In ER, tmax 99.2, HR in 70s, BP in 100s-140s-improving w/ IVFs, Satting 90-100% on RA, mainly in upper 90s. CBC and BMET essentially WNL. UA and CXR negative for infection.    Assessment and Plan: Kristie Ellison is a 68 y.o. year old female presenting with weakness, dehydration, failure to thrive   Active Problems:   Weakness   Dehydration   Adult failure to thrive   1-Weakness/Failure to thrive  -Likely multifactorial with contributions of baseline dementia and parkinsons with dehdyration -No overt signs of infection currently -pan culture -repeat CXR in am as pt is hydrate  -cont  hydration -check vit d, prealbumin, phos level  -head CT r/o intracranial pathology (subdural hematoma).   2-Dementia/Parkinsons -Continue home regimen  3-HTN -BPs normalizing with hydration -hold BP meds overnight -restart/titrate as clinically indicated.   4-Seizure d/o -no reported seizure activity  -Cont depakote  -check level as needed.   5-CKD  -At baseline -continue to follow.    FEN/GI: heart healthy diet pending bedside swallow eval.  Prophylaxis: sub q heparin  Disposition: pending further evaluation Code Status: Full Code    Patient Active Problem List   Diagnosis Date Noted  . Weakness 05/10/2014  . Dehydration 05/10/2014  . Adult failure to thrive 05/10/2014  . Caregiver with fatigue 05/05/2014  . Excessive crying of adult 05/05/2014  . Dementia with Lewy bodies 12/29/2012  . CKD (chronic kidney disease) stage 2, GFR 60-89 ml/min 11/25/2012  . Dyslipidemia 11/25/2012  . Hypoglycemia 11/24/2012  . Altered mental status 11/24/2012  . Diabetes mellitus type 2, controlled, without complications 11/24/2012  . HTN (hypertension) 11/24/2012   Past Medical History: Past Medical History  Diagnosis Date  . Hypertension   . Dementia   . Parkinson disease   . Diabetes mellitus without complication     type 2  . Hypoglycemia 11/2012  . Hyperlipemia     Past Surgical History: History reviewed. No pertinent past surgical history.  Social History: History   Social History  . Marital Status: Married    Spouse Name: Kristie Ellison    Number of Children: 4  . Years of Education: 12th   Occupational History  . retired    Social History Main Topics  .  Smoking status: Never Smoker   . Smokeless tobacco: Never Used  . Alcohol Use: No  . Drug Use: No  . Sexual Activity: No   Other Topics Concern  . None   Social History Narrative   Pt lives at home with her spouse.   Caffeine Use- 1 cup daily.     Family History: Family History  Problem Relation Age  of Onset  . Heart disease Mother   . Heart disease Father     Allergies: Allergies  Allergen Reactions  . Codeine Anxiety    Pt reports she goes crazy    Current Facility-Administered Medications  Medication Dose Route Frequency Provider Last Rate Last Dose  . 0.9 %  sodium chloride infusion  1,000 mL Intravenous Once Arthor Captain, PA-C 1,000 mL/hr at 05/10/14 1907 1,000 mL at 05/10/14 1907  . 0.9 %  sodium chloride infusion   Intravenous Continuous Doree Albee, MD      . aspirin EC tablet 81 mg  81 mg Oral Daily Doree Albee, MD      . atenolol (TENORMIN) tablet 50 mg  50 mg Oral Daily Doree Albee, MD      . atorvastatin (LIPITOR) tablet 80 mg  80 mg Oral QPM Doree Albee, MD      . carbidopa-levodopa (SINEMET IR) 25-100 MG per tablet immediate release 1 tablet  1 tablet Oral BID Doree Albee, MD      . clonazePAM Scarlette Calico) tablet 0.5 mg  0.5 mg Oral BID PRN Doree Albee, MD      . divalproex (DEPAKOTE) DR tablet 125 mg  125 mg Oral BID Doree Albee, MD      . heparin injection 5,000 Units  5,000 Units Subcutaneous 3 times per day Doree Albee, MD      . sertraline (ZOLOFT) tablet 50 mg  50 mg Oral Daily Doree Albee, MD      . traZODone (DESYREL) tablet 50 mg  50 mg Oral QHS Doree Albee, MD       Current Outpatient Prescriptions  Medication Sig Dispense Refill  . aspirin EC 81 MG tablet Take 81 mg by mouth daily.       Marland Kitchen atenolol (TENORMIN) 50 MG tablet Take 50 mg by mouth daily.       Marland Kitchen atorvastatin (LIPITOR) 80 MG tablet Take 80 mg by mouth every evening.      . carbidopa-levodopa (SINEMET IR) 25-100 MG per tablet Take 1 tablet by mouth 2 (two) times daily.  60 tablet  12  . clonazePAM (KLONOPIN) 0.5 MG tablet Take 1 tablet (0.5 mg total) by mouth 2 (two) times daily as needed for anxiety.  60 tablet  5  . divalproex (DEPAKOTE) 125 MG DR tablet Take 125 mg by mouth 2 (two) times daily.      Marland Kitchen losartan (COZAAR) 100 MG tablet Take 100 mg by mouth daily.       .  sertraline (ZOLOFT) 25 MG tablet Take 50 mg by mouth daily.      . traZODone (DESYREL) 50 MG tablet Take 50 mg by mouth at bedtime.        Review Of Systems: 12 point ROS negative except as noted above in HPI.  Physical Exam: Filed Vitals:   05/10/14 1853  BP: 140/84  Pulse:   Temp:   Resp: 22    General: slowed mentation HEENT: PERRLA and extra ocular movement intact Heart: S1, S2 normal, no murmur, rub or gallop, regular rate and rhythm Lungs: clear to  auscultation, no wheezes or rales and unlabored breathing Abdomen: abdomen is soft without significant tenderness, masses, organomegaly or guarding Extremities: extremities normal, atraumatic, no cyanosis or edema Skin:no rashes, no ecchymoses Neurology: generalized weakness, noncooperative to exam   Labs and Imaging: Lab Results  Component Value Date/Time   NA 140 05/10/2014  4:17 PM   K 3.8 05/10/2014  4:17 PM   CL 103 05/10/2014  4:17 PM   CO2 27 05/10/2014  4:17 PM   BUN 26* 05/10/2014  4:17 PM   CREATININE 1.13* 05/10/2014  4:17 PM   GLUCOSE 114* 05/10/2014  4:17 PM   Lab Results  Component Value Date   WBC 4.6 05/10/2014   HGB 12.0 05/10/2014   HCT 36.7 05/10/2014   MCV 89.3 05/10/2014   PLT 165 05/10/2014   Urinalysis    Component Value Date/Time   COLORURINE AMBER* 05/10/2014 1626   APPEARANCEUR CLOUDY* 05/10/2014 1626   LABSPEC 1.026 05/10/2014 1626   PHURINE 6.0 05/10/2014 1626   GLUCOSEU NEGATIVE 05/10/2014 1626   HGBUR NEGATIVE 05/10/2014 1626   BILIRUBINUR SMALL* 05/10/2014 1626   KETONESUR 15* 05/10/2014 1626   PROTEINUR 30* 05/10/2014 1626   UROBILINOGEN 1.0 05/10/2014 1626   NITRITE NEGATIVE 05/10/2014 1626   LEUKOCYTESUR NEGATIVE 05/10/2014 1626       Dg Chest Port 1 View  05/10/2014   CLINICAL DATA:  Altered mental status.  EXAM: PORTABLE CHEST - 1 VIEW  COMPARISON:  None.  FINDINGS: Mediastinum and hilar structures are normal. The lungs are clear. Cardiac structures are unremarkable. What appears to be ankylosis of the thoracic spine  suggesting ankylosing spondylitis noted. Degenerative changes both shoulders.  IMPRESSION: 1.  No acute cardiopulmonary disease.  2.  Ankylosing spondylitis thoracic spine.   Electronically Signed   By: Kristie Ellison  Register   On: 05/10/2014 17:10           Doree Albee MD  Pager: [phone]

## 2014-05-10 NOTE — ED Provider Notes (Signed)
CSN: [id number]     Arrival date & time 05/10/14  1557 History   First MD Initiated Contact with Patient 05/10/14 1558     Chief Complaint  Patient presents with  . Altered Mental Status     (Consider location/radiation/quality/duration/timing/severity/associated sxs/prior Treatment) HPI Kristie Ellison A 68 year old female with a past medical history of Parkinson's disease and Lewy body dementia who is brought in to the hospital by her husband for altered mental status, abdominal pain and decreased appetite. He states that over the past week she has had significantly decreased appetite. He states that she has acutely worsening weakness and is unable to walk at home or being herself as she used to do. She has been complaining of abdominal pain. Review of EMR 6 shows that this is a chronic complaint with this patient.  She was seen in the ED 2 weeks ago, Diagnosed with UTI, treated with rocephin and discharged on Kefle. She did complete her course of abx. Urine culture showed multiple bacterial morphophytes.  Past Medical History  Diagnosis Date  . Hypertension   . Dementia   . Parkinson disease   . Diabetes mellitus without complication     type 2  . Hypoglycemia 11/2012  . Hyperlipemia    History reviewed. No pertinent past surgical history. Family History  Problem Relation Age of Onset  . Heart disease Mother   . Heart disease Father    History  Substance Use Topics  . Smoking status: Never Smoker   . Smokeless tobacco: Never Used  . Alcohol Use: No   OB History   Grav Para Term Preterm Abortions TAB SAB Ect Mult Living                 Review of Systems  Unable to perform ROS: Dementia  Constitutional: Positive for activity change and appetite change. Negative for fever.  Gastrointestinal: Positive for abdominal pain.  Neurological: Positive for weakness.  ROS given by patient's husband   Allergies  Codeine  Home Medications   Prior to Admission medications    Medication Sig Start Date End Date Taking? Authorizing Provider  ALPRAZolam Prudy Feeler) 0.25 MG tablet  03/27/14   Historical Provider, MD  aspirin EC 81 MG tablet Take 81 mg by mouth daily at 2 PM daily at 2 PM.    Historical Provider, MD  atenolol (TENORMIN) 50 MG tablet Take 50 mg by mouth daily.     Historical Provider, MD  atorvastatin (LIPITOR) 80 MG tablet Take 80 mg by mouth every evening.    Historical Provider, MD  carbidopa-levodopa (SINEMET IR) 25-100 MG per tablet Take 1 tablet by mouth 2 (two) times daily. 01/02/14   Suanne Marker, MD  cephALEXin (KEFLEX) 250 MG capsule  04/20/14   Historical Provider, MD  cetirizine (ZYRTEC) 10 MG tablet Take 10 mg by mouth daily. Prn for allergies    Historical Provider, MD  clonazePAM (KLONOPIN) 0.5 MG tablet Take 1 tablet (0.5 mg total) by mouth 2 (two) times daily as needed for anxiety. 05/03/14   Ronal Fear, NP  divalproex (DEPAKOTE) 125 MG DR tablet Take 125 mg by mouth 2 (two) times daily.    Historical Provider, MD  donepezil (ARICEPT) 10 MG tablet Take 1 tablet (10 mg total) by mouth at bedtime. 01/02/14   Suanne Marker, MD  losartan (COZAAR) 100 MG tablet Take 100 mg by mouth daily.     Historical Provider, MD  polyethylene glycol (MIRALAX / GLYCOLAX) packet Take  17 g by mouth daily. Twice weekly per daughter.    Historical Provider, MD  sertraline (ZOLOFT) 25 MG tablet Take 50 mg by mouth daily. 04/11/14   Suanne Marker, MD  traMADol (ULTRAM) 50 MG tablet Take 1 tablet (50 mg total) by mouth every 6 (six) hours as needed. 04/07/14   Dione Booze, MD  traZODone (DESYREL) 50 MG tablet  03/20/14   Historical Provider, MD  zolpidem (AMBIEN) 5 MG tablet Take 1 tablet (5 mg total) by mouth at bedtime as needed for sleep. 12/07/13   Vida Roller, MD   SpO2 98% Physical Exam  Nursing note and vitals reviewed. Constitutional: No distress.  Thin, tremulous female in NAD  HENT:  Head: Normocephalic and atraumatic.  Eyes: Conjunctivae are  normal.  Neck: No JVD present.  Cardiovascular: Normal rate, regular rhythm and normal heart sounds.   Pulmonary/Chest: Effort normal and breath sounds normal. No respiratory distress. She has no wheezes. She has no rales.  Abdominal: She exhibits no distension. There is no tenderness.  Scaphoid abdomen  Neurological: She is alert. She is disoriented. She displays tremor. She exhibits abnormal muscle tone. GCS eye subscore is 4. GCS verbal subscore is 5. GCS motor subscore is 5.  Skin: Skin is warm and dry. No rash noted. She is not diaphoretic.    ED Course  Procedures (including critical care time) Labs Review Labs Reviewed - No data to display  Imaging Review No results found.   EKG Interpretation   Date/Time:  Wednesday May 10 2014 16:03:42 EDT Ventricular Rate:  77 PR Interval:  137 QRS Duration: 110 QT Interval:  445 QTC Calculation: 504 R Axis:   74 Text Interpretation:  Normal sinus rhythm T wave abnormality Anterolateral  leads Prolonged QT interval When compared with ECG of 11/24/2012 QT has  lengthened and  T wave abnormality Anterolateral leads are now Present  Confirmed by [facility]  MD, Nicholos Johns [phone]) on 05/10/2014 4:09:27 PM      MDM   Final diagnoses:  None    4:25 PM Temp(Src) 99.2 F (37.3 C) (Rectal)  SpO2 98%  Review of vitals listed below and appears that her BP is soft today.  Vitals with Age-Percentiles 05/10/2014 05/03/2014 04/11/2014 04/10/2014  Length  153.7 cm 153.7 cm   Systolic 107 90 153 144  Diastolic 68 53 78 79  Pulse  72 70 76  Respiration 25   12  Weight  48.081 kg 51.075 kg   BMI  20.4 21.7   VISIT REPORT       Vitals with Age-Percentiles 04/07/2014 04/07/2014 04/06/2014  Length   152.4 cm  Systolic 164 119 478  Diastolic 74 53 70  Pulse 65 76 71  Respiration Weight   53.524 kg  BMI   23.1  VISIT REPORT       Work up for infection begun. Urine dark on collection.    6:36 PM BP 128/76  Pulse 71   Temp(Src) 99.2 F (37.3 C) (Rectal)  Resp 22  SpO2 99% Pateint bp improved with fluids, however she has had porr oral intake, and until about 3 days ago was able to walk and take care of herself. She appears to have acute dehydration and is unable to sit without assistance.  She does not appear to have source of infection. I question if this is acute worsening of her underlying neurologic disorders. Patient is accepted for admission by Dr. Alvester Morin. I personally reviewed the imaging  tests through PACS system. I have reviewed and interpreted Lab values. I reviewed available ER/hospitalization records through the EMR  Patient seen in shared visit with attending physician. The patient appears reasonably stabilized for admission considering the current resources, flow, and capabilities available in the ED at this time, and I doubt any other [facility] requiring further screening and/or treatment in the ED prior to admission.   Arthor Captain, PA-C 05/12/14 [phone]

## 2014-05-10 NOTE — ED Notes (Signed)
Per [facility], pt has had AMS for the past 2-3 weeks. Pt has had decreased intake and output as well as dark colored urine. Pt was treated for a UTI approximately 2 weeks ago. She did complete the treatment regimen but was not rechecked to see if the infection had cleared. Pt is alert but I am unable to assess her orientation at this time due to her inability to answer questions. Pt denying pain at this time, regular, unlabored respirations noted.

## 2014-05-10 NOTE — ED Notes (Signed)
MD at bedside. 

## 2014-05-10 NOTE — ED Notes (Signed)
Report given to Ashley, RN

## 2014-05-10 NOTE — ED Notes (Signed)
4N RN Morrie Sheldon called to inform her that patient is going to CT before being transferred to the unit.

## 2014-05-11 ENCOUNTER — Observation Stay (HOSPITAL_COMMUNITY): Payer: Medicare Other

## 2014-05-11 ENCOUNTER — Telehealth: Payer: Self-pay | Admitting: Nurse Practitioner

## 2014-05-11 DIAGNOSIS — R627 Adult failure to thrive: Secondary | ICD-10-CM

## 2014-05-11 DIAGNOSIS — I129 Hypertensive chronic kidney disease with stage 1 through stage 4 chronic kidney disease, or unspecified chronic kidney disease: Secondary | ICD-10-CM | POA: Diagnosis present

## 2014-05-11 DIAGNOSIS — Z7982 Long term (current) use of aspirin: Secondary | ICD-10-CM | POA: Diagnosis not present

## 2014-05-11 DIAGNOSIS — R5381 Other malaise: Secondary | ICD-10-CM | POA: Diagnosis present

## 2014-05-11 DIAGNOSIS — R4181 Age-related cognitive decline: Secondary | ICD-10-CM | POA: Diagnosis present

## 2014-05-11 DIAGNOSIS — G40909 Epilepsy, unspecified, not intractable, without status epilepticus: Secondary | ICD-10-CM | POA: Diagnosis present

## 2014-05-11 DIAGNOSIS — E119 Type 2 diabetes mellitus without complications: Secondary | ICD-10-CM | POA: Diagnosis present

## 2014-05-11 DIAGNOSIS — E785 Hyperlipidemia, unspecified: Secondary | ICD-10-CM | POA: Diagnosis present

## 2014-05-11 DIAGNOSIS — E440 Moderate protein-calorie malnutrition: Principal | ICD-10-CM | POA: Insufficient documentation

## 2014-05-11 DIAGNOSIS — F0280 Dementia in other diseases classified elsewhere without behavioral disturbance: Secondary | ICD-10-CM | POA: Diagnosis present

## 2014-05-11 DIAGNOSIS — G3183 Dementia with Lewy bodies: Secondary | ICD-10-CM | POA: Diagnosis present

## 2014-05-11 DIAGNOSIS — Z79899 Other long term (current) drug therapy: Secondary | ICD-10-CM | POA: Diagnosis not present

## 2014-05-11 DIAGNOSIS — R1313 Dysphagia, pharyngeal phase: Secondary | ICD-10-CM | POA: Diagnosis present

## 2014-05-11 DIAGNOSIS — Z681 Body mass index (BMI) 19 or less, adult: Secondary | ICD-10-CM | POA: Diagnosis not present

## 2014-05-11 DIAGNOSIS — Z9181 History of falling: Secondary | ICD-10-CM | POA: Diagnosis not present

## 2014-05-11 DIAGNOSIS — E860 Dehydration: Secondary | ICD-10-CM | POA: Diagnosis present

## 2014-05-11 DIAGNOSIS — N182 Chronic kidney disease, stage 2 (mild): Secondary | ICD-10-CM | POA: Diagnosis present

## 2014-05-11 LAB — URINE CULTURE
Colony Count: NO GROWTH
Culture: NO GROWTH

## 2014-05-11 LAB — VITAMIN B12: Vitamin B-12: 989 pg/mL — ABNORMAL HIGH (ref 211–911)

## 2014-05-11 LAB — COMPREHENSIVE METABOLIC PANEL
ALT: 21 U/L (ref 0–35)
ANION GAP: 12 (ref 5–15)
AST: 25 U/L (ref 0–37)
Albumin: 3 g/dL — ABNORMAL LOW (ref 3.5–5.2)
Alkaline Phosphatase: 55 U/L (ref 39–117)
BUN: 24 mg/dL — AB (ref 6–23)
CALCIUM: 8.4 mg/dL (ref 8.4–10.5)
CO2: 22 mEq/L (ref 19–32)
Chloride: 110 mEq/L (ref 96–112)
Creatinine, Ser: 0.9 mg/dL (ref 0.50–1.10)
GFR, EST AFRICAN AMERICAN: 74 mL/min — AB (ref >90)
GFR, EST NON AFRICAN AMERICAN: 64 mL/min — AB (ref >90)
GLUCOSE: 87 mg/dL (ref 70–99)
Potassium: 3.8 mEq/L (ref 3.7–5.3)
Sodium: 144 mEq/L (ref 137–147)
Total Bilirubin: 0.5 mg/dL (ref 0.3–1.2)
Total Protein: 5.9 g/dL — ABNORMAL LOW (ref 6.0–8.3)

## 2014-05-11 LAB — HEMOGLOBIN A1C
Hgb A1c MFr Bld: 6 % — ABNORMAL HIGH (ref <5.7)
Mean Plasma Glucose: 126 mg/dL — ABNORMAL HIGH (ref <117)

## 2014-05-11 LAB — CBC WITH DIFFERENTIAL/PLATELET
BASOS PCT: 1 % (ref 0–1)
Basophils Absolute: 0 10*3/uL (ref 0.0–0.1)
Eosinophils Absolute: 0.1 10*3/uL (ref 0.0–0.7)
Eosinophils Relative: 1 % (ref 0–5)
HCT: 33 % — ABNORMAL LOW (ref 36.0–46.0)
HEMOGLOBIN: 10.5 g/dL — AB (ref 12.0–15.0)
Lymphocytes Relative: 30 % (ref 12–46)
Lymphs Abs: 1.3 10*3/uL (ref 0.7–4.0)
MCH: 28.5 pg (ref 26.0–34.0)
MCHC: 31.8 g/dL (ref 30.0–36.0)
MCV: 89.7 fL (ref 78.0–100.0)
MONOS PCT: 10 % (ref 3–12)
Monocytes Absolute: 0.4 10*3/uL (ref 0.1–1.0)
NEUTROS ABS: 2.5 10*3/uL (ref 1.7–7.7)
NEUTROS PCT: 58 % (ref 43–77)
PLATELETS: 164 10*3/uL (ref 150–400)
RBC: 3.68 MIL/uL — AB (ref 3.87–5.11)
RDW: 13.3 % (ref 11.5–15.5)
WBC: 4.3 10*3/uL (ref 4.0–10.5)

## 2014-05-11 LAB — GLUCOSE, CAPILLARY
Glucose-Capillary: 85 mg/dL (ref 70–99)
Glucose-Capillary: 97 mg/dL (ref 70–99)
Glucose-Capillary: 146 mg/dL — ABNORMAL HIGH (ref 70–99)

## 2014-05-11 LAB — PREALBUMIN: PREALBUMIN: 18 mg/dL (ref 17.0–34.0)

## 2014-05-11 LAB — VITAMIN D 25 HYDROXY (VIT D DEFICIENCY, FRACTURES): VIT D 25 HYDROXY: 32 ng/mL (ref 30–89)

## 2014-05-11 MED ORDER — PANTOPRAZOLE SODIUM 40 MG IV SOLR
40.0000 mg | INTRAVENOUS | Status: DC
  Administered 2014-05-11 – 2014-05-12 (×2): 40 mg via INTRAVENOUS
  Filled 2014-05-11 (×2): qty 40

## 2014-05-11 MED ORDER — ENSURE COMPLETE PO LIQD
237.0000 mL | Freq: Three times a day (TID) | ORAL | Status: DC
  Administered 2014-05-11 – 2014-05-13 (×6): 237 mL via ORAL

## 2014-05-11 MED ORDER — DEXTROSE-NACL 5-0.9 % IV SOLN
INTRAVENOUS | Status: DC
  Administered 2014-05-11: 13:00:00 via INTRAVENOUS

## 2014-05-11 NOTE — Progress Notes (Signed)
UR Completed.  Lawrie Tunks Jane [phone] 05/11/2014  

## 2014-05-11 NOTE — Consult Note (Signed)
NEURO HOSPITALIST CONSULT NOTE    Reason for Consult: medication adjustment   HPI:                                                                                                                                          Kristie Ellison is an 68 y.o. female who is followed very closely as a out patient by [facility] Dr. Marjory Lies. Per recent office visit on 05/03/14 "patient has been doing lots of whining and yelling, from the time she wakes up to the time she goes to sleep". She has been placed on Depakote 125 mg bid by PCP and recently started on Clonazepam 0.5 mg BID PRN by [facility] for agitation.  Daughter states they have been giving the clonazepam BID with no relief.  She states patient is NOT drowsy but actually seems to have agitation and possibly some insomnia. Patient will go to sleep and sleep for a good 6 hours at night.  During the day she will wake up and start "whinning, screaming and shows large mood fluctuations."  She will often state she is tired, and he home health nurse will put her to bed--once she is placed in bed she will not go to sleep but start asking for family to constantly come into the room to adjust her, bring her food or to assess her complaints. When daughter was asked specifically if she felt the medication was making her drowsy, she stated "no, in fact I do not feel like the medication sis helping at all." "she get is upset and works herself up that she will tire herself out".  Currently patient is resting comfortably, awakens with minimal stimulation.  She has received one dose of clonazepam, Depakote and Zoloft   Past Medical History  Diagnosis Date  . Hypertension   . Dementia   . Parkinson disease   . Diabetes mellitus without complication     type 2  . Hypoglycemia 11/2012  . Hyperlipemia     History reviewed. No pertinent past surgical history.  Family History  Problem Relation Age of Onset  . Heart disease Mother   . Heart disease Father      Social History:  reports that she has never smoked. She has never used smokeless tobacco. She reports that she does not drink alcohol or use illicit drugs.  Allergies  Allergen Reactions  . Codeine Anxiety    Pt reports she goes crazy    MEDICATIONS:  Scheduled: . aspirin EC  81 mg Oral Daily  . atenolol  50 mg Oral Daily  . atorvastatin  80 mg Oral QPM  . carbidopa-levodopa  1 tablet Oral BID  . divalproex  125 mg Oral BID  . feeding supplement (ENSURE COMPLETE)  237 mL Oral TID BM  . heparin  5,000 Units Subcutaneous 3 times per day  . pantoprazole (PROTONIX) IV  40 mg Intravenous Q24H  . pneumococcal 23 valent vaccine  0.5 mL Intramuscular Tomorrow-1000  . sertraline  50 mg Oral Daily  . traZODone  50 mg Oral QHS   Continuous: . dextrose 5 % and 0.9% NaCl 50 mL/hr at 05/11/14 1251   [id number]   ROS:                                                                                                                                       History obtained from daughter  General ROS: negative for - chills, fatigue, fever, night sweats, weight gain or weight loss Psychological ROS: negative for - behavioral disorder, hallucinations, memory difficulties, mood swings or suicidal ideation Ophthalmic ROS: negative for - blurry vision, double vision, eye pain or loss of vision ENT ROS: negative for - epistaxis, nasal discharge, oral lesions, sore throat, tinnitus or vertigo Allergy and Immunology ROS: negative for - hives or itchy/watery eyes Hematological and Lymphatic ROS: negative for - bleeding problems, bruising or swollen lymph nodes Endocrine ROS: negative for - galactorrhea, hair pattern changes, polydipsia/polyuria or temperature intolerance Respiratory ROS: negative for - cough, hemoptysis, shortness of breath or wheezing Cardiovascular ROS: negative for -  chest pain, dyspnea on exertion, edema or irregular heartbeat Gastrointestinal ROS: negative for - abdominal pain, diarrhea, hematemesis, nausea/vomiting or stool incontinence Genito-Urinary ROS: negative for - dysuria, hematuria, incontinence or urinary frequency/urgency Musculoskeletal ROS: negative for - joint swelling or muscular weakness Neurological ROS: as noted in HPI Dermatological ROS: negative for rash and skin lesion changes   Blood pressure 167/94, pulse 102, temperature 98.1 F (36.7 C), temperature source Oral, resp. rate 18, height  (1.651 m), weight 51.3 kg (113 lb 1.5 oz), SpO2 100.00%.   Neurologic Examination:                                                                                                      General: NAD Mental Status: Alert, not oriented to place, date or year.  Speech fluent without evidence of aphasia.  Able to follow simple commands without difficulty. Positive  masked facies Cranial Nerves: II: Discs flat bilaterally; Visual fields grossly normal, pupils equal, round, reactive to light and accommodation III,IV, VI: ptosis  Present left eye, extra-ocular motions intact bilaterally V,VII: smile symmetric, facial light touch sensation normal bilaterally VIII: hearing normal bilaterally IX,X: gag reflex present XI: bilateral shoulder shrug XII: midline tongue extension without atrophy or fasciculations  Motor: Right : Upper extremity   4/5    Left:     Upper extremity   4/5  Lower extremity   4/5     Lower extremity   4/5 --increased tone throughout, no cog wheeling noted Tone and bulk:normal tone throughout; no atrophy noted Sensory: Pinprick and light touch intact throughout, bilaterally Deep Tendon Reflexes:  Right: Upper Extremity   Left: Upper extremity   biceps (C-5 to C-6) 2/4   biceps (C-5 to C-6) 2/4 tricep (C7) 2/4    triceps (C7) 2/4 Brachioradialis (C6) 2/4  Brachioradialis (C6) 2/4  Lower Extremity Lower Extremity   quadriceps (L-2 to L-4) 2/4   quadriceps (L-2 to L-4) 2/4 Achilles (S1) 2/4   Achilles (S1) 2/4  Plantars: Mute bilaterally Cerebellar: normal finger-to-nose,  Not able to assess H-S Gait: not tested CV: pulses palpable throughout    Lab Results: Basic Metabolic Panel:  Recent Labs Lab 05/10/14 1617 05/10/14 1931 05/11/14 0626  NA 140  --  144  K 3.8  --  3.8  CL 103  --  110  CO2 27  --  22  GLUCOSE 114*  --  87  BUN 26*  --  24*  CREATININE 1.13* 0.93 0.90  CALCIUM 9.1  --  8.4  PHOS  --  2.6  --     Liver Function Tests:  Recent Labs Lab 05/10/14 1617 05/11/14 0626  AST 25 25  ALT 5 21  ALKPHOS 61 55  BILITOT 0.5 0.5  PROT 6.5 5.9*  ALBUMIN 3.4* 3.0*   No results found for this basename: LIPASE, AMYLASE,  in the last 168 hours No results found for this basename: AMMONIA,  in the last 168 hours  CBC:  Recent Labs Lab 05/10/14 1617 05/10/14 1931 05/11/14 0626  WBC 4.6 5.2 4.3  NEUTROABS 2.7  --  2.5  HGB 12.0 12.0 10.5*  HCT 36.7 36.3 33.0*  MCV 89.3 90.1 89.7  PLT 165 168 164    Cardiac Enzymes: No results found for this basename: CKTOTAL, CKMB, CKMBINDEX, TROPONINI,  in the last 168 hours  Lipid Panel: No results found for this basename: CHOL, TRIG, HDL, CHOLHDL, VLDL, LDLCALC,  in the last 168 hours  CBG:  Recent Labs Lab 05/11/14 0620 05/11/14 1249  GLUCAP 85 97    Microbiology: Results for orders placed during the hospital encounter of 05/10/14  CULTURE, BLOOD (ROUTINE X 2)     Status: None   Collection Time    05/10/14  4:39 PM      Result Value Ref Range Status   Specimen Description BLOOD ARM RIGHT   Final   Special Requests BOTTLES DRAWN AEROBIC AND ANAEROBIC 10CC   Final   Culture  Setup Time     Final   Value: 05/10/2014 20:39     Performed at [facility]   Culture     Final   Value:        BLOOD CULTURE RECEIVED NO GROWTH TO DATE CULTURE WILL BE HELD FOR 5 DAYS BEFORE ISSUING A FINAL NEGATIVE REPORT      Performed at [facility]  Report Status PENDING   Incomplete  CULTURE, BLOOD (ROUTINE X 2)     Status: None   Collection Time    05/10/14  4:50 PM      Result Value Ref Range Status   Specimen Description BLOOD LEFT FOREARM   Final   Special Requests BOTTLES DRAWN AEROBIC AND ANAEROBIC 5CC   Final   Culture  Setup Time     Final   Value: 05/10/2014 20:39     Performed at [facility]   Culture     Final   Value:        BLOOD CULTURE RECEIVED NO GROWTH TO DATE CULTURE WILL BE HELD FOR 5 DAYS BEFORE ISSUING A FINAL NEGATIVE REPORT     Performed at [facility]   Report Status PENDING   Incomplete    Coagulation Studies: No results found for this basename: LABPROT, INR,  in the last 72 hours  Imaging: Dg Chest 2 View  05/11/2014   CLINICAL DATA:  Weakness and pneumonia  EXAM: CHEST  2 VIEW  COMPARISON:  05/10/2014  FINDINGS: The heart size and mediastinal contours are within normal limits. Both lungs are clear. The visualized skeletal structures are unremarkable.  IMPRESSION: No active cardiopulmonary disease.   Electronically Signed   By: Alcide Clever M.D.   On: 05/11/2014 08:05   Ct Head Wo Contrast  05/10/2014   CLINICAL DATA:  Recent falls and weakness  EXAM: CT HEAD WITHOUT CONTRAST  TECHNIQUE: Contiguous axial images were obtained from the base of the skull through the vertex without intravenous contrast.  COMPARISON:  12/07/2013  FINDINGS: Bony calvarium is intact. No gross soft tissue abnormality is noted. Mild atrophic changes are seen commensurate the patient's given age. No acute hemorrhage, acute infarction or space-occupying mass lesion is seen.  IMPRESSION: No acute abnormality noted.   Electronically Signed   By: Alcide Clever M.D.   On: 05/10/2014 20:34   Dg Chest Port 1 View  05/10/2014   CLINICAL DATA:  Altered mental status.  EXAM: PORTABLE CHEST - 1 VIEW  COMPARISON:  None.  FINDINGS: Mediastinum and hilar structures are normal. The lungs are clear.  Cardiac structures are unremarkable. What appears to be ankylosis of the thoracic spine suggesting ankylosing spondylitis noted. Degenerative changes both shoulders.  IMPRESSION: 1.  No acute cardiopulmonary disease.  2.  Ankylosing spondylitis thoracic spine.   Electronically Signed   By: Maisie Fus  Register   On: 05/10/2014 17:10       Assessment and plan per attending neurologist  Felicie Morn PA-C [facility] [phone]  05/11/2014, 3:25 PM   Assessment/Plan:  Unfortunate 68 year old female with significant dementia and Parkinson's disease. Unfortunately, the complaints of repeated demanding behaviors can be very difficult to address. Especially since with her dementia, she will not remember having been recently demanding, it will be hard to work on these behaviors. Short of sedating the patient, which I do not advise I am not sure there is a good pharmacological intervention to be had here.  I would certainly avoid any antipsychotics in any case given the history of Parkinson's disease.  I did have a discussion with the daughter regarding whether or not she was getting to be too much to care for at home, but they feel that she is being cared for currently  One thing the daughter did bring him was the possibility of her having stomach burning group gastroenterology with starting a PPI. I think a lot of the refusal  of feeding is volitional, and will be difficult to force her to eat without feeding tube.  I would favor initially trying a dysphagia 2 diet recommended by speech therapy and PPI to see if she improves her appetite. Also could push nutritional shakes.  I don't have any further inpatient recommendations at this time, unfortunately this is a difficult case without a lot of the options. I am available if any further questions arise.   Ritta Slot, MD [facility] [phone]  If 7pm- 7am, please page neurology on call as listed in [facility].

## 2014-05-11 NOTE — Telephone Encounter (Signed)
FYI, please view previous note. Thanks

## 2014-05-11 NOTE — Progress Notes (Signed)
[facility] PROGRESS NOTE  Kristie Ellison WUJ:[id number] DOB: [birth year] DOA: 05/10/2014 PCP: Georgann Housekeeper, MD  Assessment/Plan: 1-Weakness, deconditioning, FTT:  CT head negative. Vitamin D pending. TSH: 3.030, will check B 12.  Will check MRI.  Neurology consulted.  Patient was started on Clonazepam and Zoloft by by neurologist and Depakote by PCP in the last 2 weeks.  Will defer to neurologist adjust medications.  Chest x ray negative, UA negative.   2-Parkinson, dementia:  -Continue with trazodone, Depakote, Zoloft, Clonazepam.   3-Malnutrition: nutrition consulted.   4-Dysphagia, Swallow evaluation today.   5-Abdominal pain< epigastric with meals. This might be contributing to poor oral intake. GI consulted for possible endoscopy rule out ulcer.  6-Hypertension: SBP soft. Hold BP medication.   Code Status: Full Code.  Family Communication: care discussed with daughter, patient husband and son.  Disposition Plan: remain inpatient.    Consultants:  Alberteen Sam,  Neurology  Procedures:  none  Antibiotics:  none  HPI/Subjective: Per daughter patient with progressive weakness, decrease oral intake for last 2  To 3 weeks. Patient also complaining of abdominal pain after eating. Patient with weight loss.   Objective: Filed Vitals:   05/11/14 0937  BP: 106/54  Pulse: 79  Temp: 98.9 F (37.2 C)  Resp: 18   No intake or output data in the 24 hours ending 05/11/14 1109 Filed Weights   05/10/14 2114  Weight: 51.3 kg (113 lb 1.5 oz)    Exam:   General: Alert, thin, no distress.   Cardiovascular: S 1, S 2 RRR  Respiratory: CTA  Abdomen: BS present, soft, Mild tender, no rigidity.   Musculoskeletal: no edema.    Data Reviewed: Basic Metabolic Panel:  Recent Labs Lab 05/10/14 1617 05/10/14 1931 05/11/14 0626  NA 140  --  144  K 3.8  --  3.8  CL 103  --  110  CO2 27  --  22  GLUCOSE 114*  --  87  BUN 26*  --  24*  CREATININE 1.13* 0.93  0.90  CALCIUM 9.1  --  8.4  PHOS  --  2.6  --    Liver Function Tests:  Recent Labs Lab 05/10/14 1617 05/11/14 0626  AST 25 25  ALT 5 21  ALKPHOS 61 55  BILITOT 0.5 0.5  PROT 6.5 5.9*  ALBUMIN 3.4* 3.0*   No results found for this basename: LIPASE, AMYLASE,  in the last 168 hours No results found for this basename: AMMONIA,  in the last 168 hours CBC:  Recent Labs Lab 05/10/14 1617 05/10/14 1931 05/11/14 0626  WBC 4.6 5.2 4.3  NEUTROABS 2.7  --  2.5  HGB 12.0 12.0 10.5*  HCT 36.7 36.3 33.0*  MCV 89.3 90.1 89.7  PLT 165 168 164   Cardiac Enzymes: No results found for this basename: CKTOTAL, CKMB, CKMBINDEX, TROPONINI,  in the last 168 hours BNP (last 3 results)  Recent Labs  05/10/14 1932  PROBNP 4479.0*   CBG:  Recent Labs Lab 05/11/14 0620  GLUCAP 85    Recent Results (from the past 240 hour(s))  CULTURE, BLOOD (ROUTINE X 2)     Status: None   Collection Time    05/10/14  4:39 PM      Result Value Ref Range Status   Specimen Description BLOOD ARM RIGHT   Final   Special Requests BOTTLES DRAWN AEROBIC AND ANAEROBIC 10CC   Final   Culture  Setup Time     Final   Value:  05/10/2014 20:39     Performed at [facility]   Culture     Final   Value:        BLOOD CULTURE RECEIVED NO GROWTH TO DATE CULTURE WILL BE HELD FOR 5 DAYS BEFORE ISSUING A FINAL NEGATIVE REPORT     Performed at [facility]   Report Status PENDING   Incomplete  CULTURE, BLOOD (ROUTINE X 2)     Status: None   Collection Time    05/10/14  4:50 PM      Result Value Ref Range Status   Specimen Description BLOOD LEFT FOREARM   Final   Special Requests BOTTLES DRAWN AEROBIC AND ANAEROBIC 5CC   Final   Culture  Setup Time     Final   Value: 05/10/2014 20:39     Performed at [facility]   Culture     Final   Value:        BLOOD CULTURE RECEIVED NO GROWTH TO DATE CULTURE WILL BE HELD FOR 5 DAYS BEFORE ISSUING A FINAL NEGATIVE REPORT     Performed at [facility]   Report Status PENDING   Incomplete     Studies: Dg Chest 2 View  05/11/2014   CLINICAL DATA:  Weakness and pneumonia  EXAM: CHEST  2 VIEW  COMPARISON:  05/10/2014  FINDINGS: The heart size and mediastinal contours are within normal limits. Both lungs are clear. The visualized skeletal structures are unremarkable.  IMPRESSION: No active cardiopulmonary disease.   Electronically Signed   By: Alcide Clever M.D.   On: 05/11/2014 08:05   Ct Head Wo Contrast  05/10/2014   CLINICAL DATA:  Recent falls and weakness  EXAM: CT HEAD WITHOUT CONTRAST  TECHNIQUE: Contiguous axial images were obtained from the base of the skull through the vertex without intravenous contrast.  COMPARISON:  12/07/2013  FINDINGS: Bony calvarium is intact. No gross soft tissue abnormality is noted. Mild atrophic changes are seen commensurate the patient's given age. No acute hemorrhage, acute infarction or space-occupying mass lesion is seen.  IMPRESSION: No acute abnormality noted.   Electronically Signed   By: Alcide Clever M.D.   On: 05/10/2014 20:34   Dg Chest Port 1 View  05/10/2014   CLINICAL DATA:  Altered mental status.  EXAM: PORTABLE CHEST - 1 VIEW  COMPARISON:  None.  FINDINGS: Mediastinum and hilar structures are normal. The lungs are clear. Cardiac structures are unremarkable. What appears to be ankylosis of the thoracic spine suggesting ankylosing spondylitis noted. Degenerative changes both shoulders.  IMPRESSION: 1.  No acute cardiopulmonary disease.  2.  Ankylosing spondylitis thoracic spine.   Electronically Signed   By: Maisie Fus  Register   On: 05/10/2014 17:10    Scheduled Meds: . aspirin EC  81 mg Oral Daily  . atenolol  50 mg Oral Daily  . atorvastatin  80 mg Oral QPM  . carbidopa-levodopa  1 tablet Oral BID  . divalproex  125 mg Oral BID  . heparin  5,000 Units Subcutaneous 3 times per day  . pneumococcal 23 valent vaccine  0.5 mL Intramuscular Tomorrow-1000  . sertraline  50 mg Oral Daily  .  traZODone  50 mg Oral QHS   Continuous Infusions: . sodium chloride 75 mL/hr at 05/11/14 9147    Active Problems:   Weakness   Dehydration   Adult failure to thrive    Time spent: 25 minutes.     [name], [employer] A  [facility] designer  [phone]. If 7PM-7AM, please contact night-coverage at [email or web address], password [facility] 05/11/2014, 11:09 AM  LOS: 1 day

## 2014-05-11 NOTE — Progress Notes (Signed)
INITIAL NUTRITION ASSESSMENT  DOCUMENTATION CODES Per approved criteria  -Non-severe (moderate) malnutrition in the context of chronic illness  Pt meets criteria for MODERATE MALNUTRITION in the context of CHRONIC ILLNESS as evidenced by 10% weight loss in less than 6 months, moderate loss of subcutaneous fat, moderate loss of muscle mass, and estimated energy intake <75% of estimated energy needs for > 1 month.  INTERVENTION: Provide Ensure Complete TID between meals Encourage PO intake Magic Cup with meals as desired  NUTRITION DIAGNOSIS: Inadequate oral intake related to poor appetite as evidenced by 10% weight loss in less than 2 months.   Goal: Pt to meet >/= 90% of their estimated nutrition needs   Monitor:  PO intake, weight trend, labs  Reason for Assessment: Malnutrition Screening Tool/ Consult  68 y.o. female  Admitting Dx: Weakness, dehydration  ASSESSMENT: 68 y.o. year old female with significant past medical history of end stage dementia and parkinsons disease, type 2 DM, HTN, CKD presenting with weakness, dehydration, failure to thrive. Family states that pt has had progressive decreased appetite, weight loss, and weakness over the past 2-3 weeks.   Pt reports that for the past 2 months her appetite has been very poor and she has been eating very little. She reports losing weight from her usual body weight of 125 lbs. Weight history shows pt lost down to 106 lbs. Based on pt's report she has lost 10% of her body weight in the past 2 months. She reports she started drinking Ensure supplements 3 times daily. Family at bedside report pt at most eats a few bites at each meal but, mostly pt just drinks Ensure.  Pt's diet was advance to Dysphagia 2 with thin liquid just prior to visit. Pt states she feels hungry- tray ordered.  Nutrition Focused Physical Exam:  Subcutaneous Fat:  Orbital Region: mild wasting Upper Arm Region: moderate wasting Thoracic and Lumbar Region:  NA  Muscle:  Temple Region: moderate wasting Clavicle Bone Region: moderate wasting Clavicle and Acromion Bone Region: moderate wasting Scapular Bone Region: NA Dorsal Hand: moderate wasting Patellar Region: moderate wasting Anterior Thigh Region: moderate wasting Posterior Calf Region: mild wasting  Edema: none noted  Height: Ht Readings from Last 1 Encounters:  05/10/14  (1.651 m)    Weight: Wt Readings from Last 1 Encounters:  05/10/14 113 lb 1.5 oz (51.3 kg)    Ideal Body Weight: 125 lbs  % Ideal Body Weight: 90%  Wt Readings from Last 10 Encounters:  05/10/14 113 lb 1.5 oz (51.3 kg)  05/03/14 106 lb (48.081 kg)  04/11/14 112 lb 9.6 oz (51.075 kg)  04/06/14 118 lb (53.524 kg)  01/02/14 124 lb (56.246 kg)  12/29/13 124 lb (56.246 kg)  07/26/13 126 lb (57.153 kg)  07/04/13 120 lb (54.432 kg)  12/29/12 123 lb 8 oz (56.019 kg)  11/27/12 123 lb 11.2 oz (56.11 kg)    Usual Body Weight: 125 lbs  % Usual Body Weight: 90%  BMI:  Body mass index is 18.82 kg/(m^2).  Estimated Nutritional Needs: Kcal: 1500-1700 Protein: 60-70 grams Fluid: 1.5-1.7 L/day  Skin: intact  Diet Order: Dysphagia 2 , thin liquids  EDUCATION NEEDS: -No education needs identified at this time   Intake/Output Summary (Last 24 hours) at 05/11/14 1435 Last data filed at 05/11/14 1100  Gross per 24 hour  Intake      0 ml  Output      0 ml  Net      0 ml  Last BM: 9/1  Labs:   Recent Labs Lab 05/10/14 1617 05/10/14 1931 05/11/14 0626  NA 140  --  144  K 3.8  --  3.8  CL 103  --  110  CO2 27  --  22  BUN 26*  --  24*  CREATININE 1.13* 0.93 0.90  CALCIUM 9.1  --  8.4  PHOS  --  2.6  --   GLUCOSE 114*  --  87    CBG (last 3)   Recent Labs  05/11/14 0620 05/11/14 1249  GLUCAP 85 97    Scheduled Meds: . aspirin EC  81 mg Oral Daily  . atenolol  50 mg Oral Daily  . atorvastatin  80 mg Oral QPM  . carbidopa-levodopa  1 tablet Oral BID  . divalproex  125  mg Oral BID  . heparin  5,000 Units Subcutaneous 3 times per day  . pantoprazole (PROTONIX) IV  40 mg Intravenous Q24H  . pneumococcal 23 valent vaccine  0.5 mL Intramuscular Tomorrow-1000  . sertraline  50 mg Oral Daily  . traZODone  50 mg Oral QHS    Continuous Infusions: . dextrose 5 % and 0.9% NaCl 50 mL/hr at 05/11/14 1251    Past Medical History  Diagnosis Date  . Hypertension   . Dementia   . Parkinson disease   . Diabetes mellitus without complication     type 2  . Hypoglycemia 11/2012  . Hyperlipemia     History reviewed. No pertinent past surgical history.  Ian Malkin RD, LDN Inpatient Clinical Dietitian Pager: [phone] After Hours Pager: [phone]

## 2014-05-11 NOTE — Evaluation (Signed)
Physical Therapy Evaluation Patient Details Name: Kristie Ellison MRN: [id number] DOB: [birth year] Today's Date: 05/11/2014   History of Present Illness  68 y.o. year old female with significant past medical history of end stage dementia and parkinsons disease, type 2 DM, HTN, CKD presenting with weakness, dehydration, failure to thrive. Family states that pt has had progressive decreased appetite, weight loss, and weakness over the past 2-3 weeks. Has baseline lewy body dementia and parkinsons. Has had general decline over the past 2 years. Was previously only able to minimally ambulate with a rolling walker. However has not been able to ambulate over this time frame. Noted to have been treated for UTI within the past 2 weeks. Has complete abx course. Family states urination frequency has significantly decreased. Has had some falls at home. Unsure of head trauma.CT shows no acute infarct, chest view shows no acute cardiopulmonary disease.   Clinical Impression  Pt adm due to above. Pt presents with decreased functional mobility secondary to deficits indicated below. Discussed at length with daughter POC and goals for therapy. Pt and family to benefit from skilled PT to educate family regarding, positioning, ROM exercises, transfer techniques, etc. Prior to pt returning home with family. Also discussed with daughter the possibility of D/C with hospital bed that can be on first floor of house to avoid stair management for pt.  Daughter very interested in DME and HHPT; will recommend CM speak with pt and family regarding D/C needs. Pt and family great candidates for palliative consult at this time, RN made aware.   Follow Up Recommendations Home health PT;Supervision/Assistance - 24 hour    Equipment Recommendations  3in1 (PT);Wheelchair (measurements PT);Wheelchair cushion (measurements PT);Hospital bed    Recommendations for Other Services OT consult;Other (comment) (palliative consult )      Precautions / Restrictions Precautions Precautions: Fall Precaution Comments: daughter reports multiple falls at home  Restrictions Weight Bearing Restrictions: No      Mobility  Bed Mobility Overal bed mobility: +2 for physical assistance;Needs Assistance Bed Mobility: Rolling;Sidelying to Sit;Sit to Sidelying Rolling: Mod assist Sidelying to sit: +2 for physical assistance;Total assist;HOB elevated     Sit to sidelying: +2 for physical assistance;Total assist General bed mobility comments: pt rolled to remove bedpan; 2 person (A) for all bed mobility; decr ablity to follow commands at this time; pt more alert sitting EOb  Transfers Overall transfer level: Needs assistance Equipment used: 2 person hand held assist Transfers: Sit to/from Stand Sit to Stand: +2 physical assistance;Max assist         General transfer comment: pt attempting to stand while sitting EOB: required 2 person (A0 with support through gt belt and positiong pt hands on the (A) arms; pt with heavy lean posteriorly; tolerated standing ~1 min; pt appeared very fatigued   Ambulation/Gait                Stairs            Wheelchair Mobility    Modified Rankin (Stroke Patients Only)       Balance Overall balance assessment: Needs assistance Sitting-balance support: Feet supported;No upper extremity supported Sitting balance-Leahy Scale: Poor Sitting balance - Comments: requires posterior bracing >50% of session; tolerated sitting EOB ~7 min; more alert at EOB at times but required max cueing to keep eyes open and focused on tasks  Postural control: Posterior lean Standing balance support: During functional activity;Bilateral upper extremity supported Standing balance-Leahy Scale: Zero Standing balance comment: 2 person (A) with  heavy lean posteriorly                              Pertinent Vitals/Pain Pain Assessment: No/denies pain    Home Living Family/patient expects  to be discharged to:: Private residence Living Arrangements: Spouse/significant other Available Help at Discharge: Family;Available 24 hours/day Type of Home: House Home Access: Stairs to enter Entrance Stairs-Rails: None Entrance Stairs-Number of Steps: 1 Home Layout: Two level;Bed/bath upstairs Home Equipment: Walker - 2 wheels;Cane - single point Additional Comments: pt lives with daughter and stays primarily on 2nd floor at all times     Prior Function Level of Independence: Needs assistance   Gait / Transfers Assistance Needed: for over week pt has required physical (A) for all mobility and gt   ADL's / Homemaking Assistance Needed: total (A)         Hand Dominance        Extremity/Trunk Assessment   Upper Extremity Assessment: Defer to OT evaluation           Lower Extremity Assessment: Difficult to assess due to impaired cognition;Generalized weakness      Cervical / Trunk Assessment: Other exceptions  Communication   Communication: Expressive difficulties  Cognition Arousal/Alertness: Lethargic Behavior During Therapy: Flat affect;Restless Overall Cognitive Status: History of cognitive impairments - at baseline (pt with dementia )       Memory: Decreased short-term memory              General Comments General comments (skin integrity, edema, etc.): discussed with dadughter POC and goals of care with PT at this time; daughter very supportive and helpful     Exercises        Assessment/Plan    PT Assessment Patient needs continued PT services  PT Diagnosis Altered mental status;Generalized weakness;Difficulty walking   PT Problem List Decreased strength;Decreased activity tolerance;Decreased balance;Decreased mobility;Decreased cognition;Decreased range of motion;Decreased coordination;Decreased knowledge of use of DME;Decreased safety awareness  PT Treatment Interventions DME instruction;Functional mobility training;Gait training;Therapeutic  activities;Therapeutic exercise;Balance training;Neuromuscular re-education;Patient/family education;Cognitive remediation;Wheelchair mobility training   PT Goals (Current goals can be found in the Care Plan section) Acute Rehab PT Goals Patient Stated Goal: pt udid not state; per daughter the goal is to care and take mother home  PT Goal Formulation: With patient/family Time For Goal Achievement: 05/18/14 Potential to Achieve Goals: Fair    Frequency Min 2X/week   Barriers to discharge Inaccessible home environment pt bed on 2nd floor     Co-evaluation               End of Session Equipment Utilized During Treatment: Gait belt Activity Tolerance: Patient limited by lethargy Patient left: in bed;with call bell/phone within reach;with bed alarm set;with nursing/sitter in room Nurse Communication: Mobility status;Precautions;Need for lift equipment         Time: 1205-1226 PT Time Calculation (min): 21 min   Charges:   PT Evaluation $Initial PT Evaluation Tier I: 1 Procedure PT Treatments $Therapeutic Activity: 8-22 mins   PT G CodesDonell Sievert, [place]  [phone] 05/11/2014, 4:13 PM

## 2014-05-11 NOTE — Telephone Encounter (Signed)
FYI, Daughter [name] wanted to make Larita Fife, NP/Dr. Marjory Lies aware that patient was admitted to hospital [facility]).

## 2014-05-11 NOTE — Consult Note (Signed)
Subjective:   HPI  The patient is a 68 year old female with a history of progressive Parkinson's disease and dementia. We are asked to see her in regards to dysphagia, and reported upper abdominal pain. The patient is unable to give much of a history but I spoke to her daughter area she states that the patient has been losing weight and not eating well. She states that she keeps her food in her mouth a long time but that once it does go down it seems to go down okay. She did not describe that the food was getting stuck in the esophagus to me. The poor oral intake has contributed to a weight loss of about 22 pounds over the last month or so. The patient had a bedside barium swallow which demonstrates a moderate cognitive based dysphagia. This makes it difficult for self feeding. She has difficulty chewing solids. It was suggested that do to her reduced cognitive function and ability to clear her solids that a dysphagia 2 diet with thin liquids be tried. The patient's daughter reported that the patient sometimes states she feels hot in the abdomen after eating which raised the question to her of possible reflux. She has never been treated for reflux. The patient has seen Dr. Bosie Clos in the past. He was advised to be conservative with management do to her overall frail condition. 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 This did not appear serious.  Review of Systems Unobtainable  Past Medical History  Diagnosis Date  . Hypertension   . Dementia   . Parkinson disease   . Diabetes mellitus without complication     type 2  . Hypoglycemia 11/2012  . Hyperlipemia    History reviewed. No pertinent past surgical history. History   Social History  . Marital Status: Married    Spouse Name: Maisie Fus    Number of Children: 4  . Years of Education: 12th   Occupational History  . retired    Social History Main Topics  . Smoking status: Never Smoker   . Smokeless tobacco:  Never Used  . Alcohol Use: No  . Drug Use: No  . Sexual Activity: No   Other Topics Concern  . Not on file   Social History Narrative   Pt lives at home with her spouse.   Caffeine Use- 1 cup daily.    family history includes Heart disease in her father and mother. Current facility-administered medications:aspirin EC tablet 81 mg, 81 mg, Oral, Daily, Doree Albee, MD, 81 mg at 05/11/14 1419;  atenolol (TENORMIN) tablet 50 mg, 50 mg, Oral, Daily, Doree Albee, MD, 50 mg at 05/11/14 1419;  atorvastatin (LIPITOR) tablet 80 mg, 80 mg, Oral, QPM, Doree Albee, MD, 80 mg at 05/11/14 1419 carbidopa-levodopa (SINEMET IR) 25-100 MG per tablet immediate release 1 tablet, 1 tablet, Oral, BID, Doree Albee, MD, 1 tablet at 05/11/14 1419;  clonazePAM (KLONOPIN) tablet 0.5 mg, 0.5 mg, Oral, BID PRN, Doree Albee, MD, 0.5 mg at 05/11/14 1419;  dextrose 5 %-0.9 % sodium chloride infusion, , Intravenous, Continuous, Belkys A Regalado, MD, Last Rate: 50 mL/hr at 05/11/14 1251 divalproex (DEPAKOTE) DR tablet 125 mg, 125 mg, Oral, BID, Doree Albee, MD;  heparin injection 5,000 Units, 5,000 Units, Subcutaneous, 3 times per day, Doree Albee, MD, 5,000 Units at 05/11/14 1419;  pneumococcal 23 valent vaccine (PNU-IMMUNE) injection 0.5 mL, 0.5 mL, Intramuscular, Tomorrow-1000, Doree Albee, MD;  sertraline (ZOLOFT) tablet 50 mg, 50 mg,  Oral, Daily, Doree Albee, MD, 50 mg at 05/11/14 1419 traZODone (DESYREL) tablet 50 mg, 50 mg, Oral, QHS, Doree Albee, MD Allergies  Allergen Reactions  . Codeine Anxiety    Pt reports she goes crazy     Objective:     BP 167/94  Pulse 102  Temp(Src) 98.1 F (36.7 C) (Oral)  Resp 18  Ht  (1.651 m)  Wt 51.3 kg (113 lb 1.5 oz)  BMI 18.82 kg/m2  SpO2 100%  She is in no distress. She is confused. She is moaning.  Heart regular rhythm no murmurs  Lungs clear  Abdomen: Bowel sounds normal, soft, nontender  Laboratory No components found with this  basename: d1      Assessment:     Dysphagia. I believe this is more of an oral pharyngeal dysphagia related to her Parkinson's disease and dementia.  Abdominal pain. This could be related to acid peptic disease. She has not been on any PPI therapy.      Plan:     In regards to the dysphagia I would follow the advice of speech pathology  In regards to the abdominal pain I would recommend a therapeutic trial of PPI therapy. Given her overall medical condition and frail nature I would agree with Dr. Bosie Clos in his prior office assessment to be conservative. I would therefore not advise endoscopy at this time.    Component Value Date/Time   WBC 4.3 05/11/2014 0626   HGB 10.5* 05/11/2014 0626   HCT 33.0* 05/11/2014 0626   PLT 164 05/11/2014 0626   ALT 21 05/11/2014 0626   AST 25 05/11/2014 0626   NA 144 05/11/2014 0626   K 3.8 05/11/2014 0626   CL 110 05/11/2014 0626   CREATININE 0.90 05/11/2014 0626   BUN 24* 05/11/2014 0626   CO2 22 05/11/2014 0626   CALCIUM 8.4 05/11/2014 0626   PHOS 2.6 05/10/2014 1931   ALKPHOS 55 05/11/2014 0626    Lab Results  Component Value Date   HGB 10.5* 05/11/2014   HGB 12.0 05/10/2014   HGB 12.0 05/10/2014   HCT 33.0* 05/11/2014   HCT 36.3 05/10/2014   HCT 36.7 05/10/2014   ALKPHOS 55 05/11/2014   ALKPHOS 61 05/10/2014   ALKPHOS 65 04/06/2014   AST 25 05/11/2014   AST 25 05/10/2014   AST 29 04/06/2014   ALT 21 05/11/2014   ALT 5 05/10/2014   ALT 17 04/06/2014

## 2014-05-11 NOTE — Evaluation (Signed)
Clinical/Bedside Swallow Evaluation Patient Details  Name: Kristie Ellison MRN: [id number] Date of Birth: [birth year]  Today's Date: 05/11/2014 Time: 4098-1191 SLP Time Calculation (min): 30 min  Past Medical History:  Past Medical History  Diagnosis Date  . Hypertension   . Dementia   . Parkinson disease   . Diabetes mellitus without complication     type 2  . Hypoglycemia 11/2012  . Hyperlipemia    Past Surgical History: History reviewed. No pertinent past surgical history. HPI:  68 y.o. year old female with significant past medical history of end stage dementia and parkinsons disease, type 2 DM, HTN, CKD presenting with weakness, dehydration, failure to thrive. Family states that pt has had progressive decreased appetite, weight loss, and weakness over the past 2-3 weeks. Has baseline lewy body dementia and parkinsons. Has had general decline over the past 2 years. Was previously only able to minimally ambulate with a rolling walker. However has not been able to ambulate over this time frame. Noted to have been treated for UTI within the past 2 weeks. Has complete abx course. Family states urination frequency has significantly decreased. Has had some falls at home. Unsure of head trauma.CT shows no acute infarct, chest view shows no acute cardiopulmonary disease.   Assessment / Plan / Recommendation Clinical Impression  Pt demonstrates a moderate cognitive based dysphagia. She presents a moderate risk of aspiration due to impaired cognitive status. She has advanced Lewy Body dementia and the resultant cognitive deficits seem to be impairing her awareness and ability to self feed, making assistance with holding and placing her utensil necessary. Pt has reduced speed and ROM for chewing solids, placing her on a softer diet will allow for energy conservation to continue feedings. Due to her reduced cognitive function, ability to clear solids, and family reported chopped diet at home, recommend a  dysphagia 2 diet (fine chop) with thin liquids. SLP educated pt's daughter on feeding strategies, oral care, diet recommendations, and counseled the daughter on future considerations for feeding. Recommend follow up treatment for further education, diet check, and texture progression.    Aspiration Risk  Mild    Diet Recommendation Dysphagia 2 (Fine chop)   Liquid Administration via: Cup Medication Administration: Crushed with puree Postural Changes and/or Swallow Maneuvers: Seated upright 90 degrees    Other  Recommendations Oral Care Recommendations: Oral care before and after PO   Follow Up Recommendations       Frequency and Duration min 2x/week  2 weeks   Pertinent Vitals/Pain none    SLP Swallow Goals     Swallow Study Prior Functional Status       General Date of Onset: 05/10/14 HPI: 68 y.o. year old female with significant past medical history of end stage dementia and parkinsons disease, type 2 DM, HTN, CKD presenting with weakness, dehydration, failure to thrive. Family states that pt has had progressive decreased appetite, weight loss, and weakness over the past 2-3 weeks. Has baseline lewy body dementia and parkinsons. Has had general decline over the past 2 years. Was previously only able to minimally ambulate with a rolling walker. However has not been able to ambulate over this time frame. Noted to have been treated for UTI within the past 2 weeks. Has complete abx course. Family states urination frequency has significantly decreased. Has had some falls at home. Unsure of head trauma.CT shows no acute infarct, chest view shows no acute cardiopulmonary disease. Type of Study: Bedside swallow evaluation Diet Prior to this Study:  NPO Temperature Spikes Noted: No Respiratory Status: Room air Behavior/Cognition: Cooperative;Pleasant mood;Requires cueing Oral Cavity - Dentition: Edentulous;Poor condition Self-Feeding Abilities: Able to feed self;Needs assist Patient  Positioning: Upright in bed Baseline Vocal Quality: Clear;Low vocal intensity    Oral/Motor/Sensory Function Overall Oral Motor/Sensory Function: Appears within functional limits for tasks assessed Labial ROM: Within Functional Limits Labial Symmetry: Within Functional Limits Labial Strength: Within Functional Limits Labial Sensation: Within Functional Limits Lingual ROM: Within Functional Limits Lingual Symmetry: Within Functional Limits Lingual Strength: Within Functional Limits Lingual Sensation: Within Functional Limits Facial ROM: Within Functional Limits Facial Symmetry: Within Functional Limits Facial Strength: Within Functional Limits Facial Sensation: Within Functional Limits   Ice Chips     Thin Liquid Thin Liquid: Within functional limits Presentation: Cup;Straw    Nectar Thick     Honey Thick     Puree Puree: Within functional limits Presentation: Self Fed;Spoon Other Comments: needs assistance   Solid   GO    Solid: Within functional limits Presentation: Self Fed       Kristie Ellison 05/11/2014,1:32 PM

## 2014-05-11 NOTE — Plan of Care (Signed)
Problem: Acute Rehab PT Goals(only PT should resolve) Goal: Pt Will Go Supine/Side To Sit For pt to be able to demo safe technique for bed mobility for repositioning at home for pressure relief.  Goal: Patient Will Transfer Sit To/From Stand For family to be able to demonstrate safe ability to transfer pt to standing for pressure relief and mobility at home.  Goal: Pt Will Transfer Bed To Chair/Chair To Bed For family to demo safe technique to transfer pt <> bed.  Goal: Pt/caregiver will Perform Home Exercise Program For family to demo proper technique for PROM to prevent contractures in LEs.

## 2014-05-11 NOTE — Telephone Encounter (Signed)
Noted  

## 2014-05-12 ENCOUNTER — Inpatient Hospital Stay (HOSPITAL_COMMUNITY): Payer: Medicare Other

## 2014-05-12 DIAGNOSIS — E440 Moderate protein-calorie malnutrition: Secondary | ICD-10-CM | POA: Diagnosis not present

## 2014-05-12 LAB — COMPREHENSIVE METABOLIC PANEL
ALBUMIN: 2.6 g/dL — AB (ref 3.5–5.2)
ALT: 7 U/L (ref 0–35)
AST: 22 U/L (ref 0–37)
Alkaline Phosphatase: 49 U/L (ref 39–117)
Anion gap: 8 (ref 5–15)
BUN: 25 mg/dL — ABNORMAL HIGH (ref 6–23)
CO2: 24 mEq/L (ref 19–32)
CREATININE: 0.94 mg/dL (ref 0.50–1.10)
Calcium: 8.1 mg/dL — ABNORMAL LOW (ref 8.4–10.5)
Chloride: 111 mEq/L (ref 96–112)
GFR calc Af Amer: 70 mL/min — ABNORMAL LOW (ref >90)
GFR calc non Af Amer: 61 mL/min — ABNORMAL LOW (ref >90)
Glucose, Bld: 110 mg/dL — ABNORMAL HIGH (ref 70–99)
Potassium: 3.6 mEq/L — ABNORMAL LOW (ref 3.7–5.3)
SODIUM: 143 meq/L (ref 137–147)
Total Bilirubin: 0.4 mg/dL (ref 0.3–1.2)
Total Protein: 5.1 g/dL — ABNORMAL LOW (ref 6.0–8.3)

## 2014-05-12 LAB — CBC WITH DIFFERENTIAL/PLATELET
Basophils Absolute: 0 10*3/uL (ref 0.0–0.1)
Basophils Relative: 0 % (ref 0–1)
EOS PCT: 2 % (ref 0–5)
Eosinophils Absolute: 0.1 10*3/uL (ref 0.0–0.7)
HEMATOCRIT: 28.7 % — AB (ref 36.0–46.0)
HEMOGLOBIN: 9.3 g/dL — AB (ref 12.0–15.0)
LYMPHS ABS: 1.1 10*3/uL (ref 0.7–4.0)
LYMPHS PCT: 35 % (ref 12–46)
MCH: 28.9 pg (ref 26.0–34.0)
MCHC: 32.4 g/dL (ref 30.0–36.0)
MCV: 89.1 fL (ref 78.0–100.0)
MONO ABS: 0.4 10*3/uL (ref 0.1–1.0)
MONOS PCT: 13 % — AB (ref 3–12)
Neutro Abs: 1.6 10*3/uL — ABNORMAL LOW (ref 1.7–7.7)
Neutrophils Relative %: 50 % (ref 43–77)
Platelets: 137 10*3/uL — ABNORMAL LOW (ref 150–400)
RBC: 3.22 MIL/uL — ABNORMAL LOW (ref 3.87–5.11)
RDW: 13.4 % (ref 11.5–15.5)
WBC: 3.3 10*3/uL — AB (ref 4.0–10.5)

## 2014-05-12 LAB — GLUCOSE, CAPILLARY: Glucose-Capillary: 135 mg/dL — ABNORMAL HIGH (ref 70–99)

## 2014-05-12 MED ORDER — POTASSIUM CHLORIDE CRYS ER 20 MEQ PO TBCR
40.0000 meq | EXTENDED_RELEASE_TABLET | Freq: Once | ORAL | Status: AC
  Administered 2014-05-12: 40 meq via ORAL
  Filled 2014-05-12: qty 2

## 2014-05-12 MED ORDER — LORAZEPAM 2 MG/ML IJ SOLN
0.5000 mg | Freq: Once | INTRAMUSCULAR | Status: AC
  Administered 2014-05-12: 19:00:00 via INTRAVENOUS

## 2014-05-12 MED ORDER — PANTOPRAZOLE SODIUM 40 MG PO TBEC: 40.0000 mg | DELAYED_RELEASE_TABLET | Freq: Two times a day (BID) | ORAL | Status: DC

## 2014-05-12 MED ORDER — ADULT MULTIVITAMIN LIQUID CH
5.0000 mL | Freq: Every day | ORAL | Status: DC
  Administered 2014-05-12 – 2014-05-13 (×2): 5 mL via ORAL
  Filled 2014-05-12 (×3): qty 5

## 2014-05-12 MED ORDER — ENSURE COMPLETE PO LIQD: 237.0000 mL | Freq: Three times a day (TID) | ORAL | Status: AC

## 2014-05-12 MED ORDER — LORAZEPAM 2 MG/ML IJ SOLN
INTRAMUSCULAR | Status: AC
  Filled 2014-05-12: qty 1

## 2014-05-12 NOTE — ED Provider Notes (Signed)
Medical screening examination/treatment/procedure(s) were performed by non-physician practitioner and as supervising physician I was immediately available for consultation/collaboration.   EKG Interpretation   Date/Time:  Wednesday May 10 2014 16:03:42 EDT Ventricular Rate:  77 PR Interval:  137 QRS Duration: 110 QT Interval:  445 QTC Calculation: 504 R Axis:   74 Text Interpretation:  Normal sinus rhythm T wave abnormality Anterolateral  leads Prolonged QT interval When compared with ECG of 11/24/2012 QT has  lengthened and  T wave abnormality Anterolateral leads are now Present  Confirmed by [facility]  MD, Nicholos Johns [phone]) on 05/10/2014 4:09:27 PM        Samuel Jester, DO 05/12/14 2011

## 2014-05-12 NOTE — Evaluation (Signed)
Occupational Therapy Evaluation Patient Details Name: Kristie Ellison MRN: [id number] DOB: Apr 07, 1946 Today's Date: 05/12/2014    History of Present Illness 68 y.o. year old female with significant past medical history of end stage dementia and parkinsons disease, type 2 DM, HTN, CKD presenting with weakness, dehydration, failure to thrive. Family states that pt has had progressive decreased appetite, weight loss, and weakness over the past 2-3 weeks. Has baseline lewy body dementia and parkinsons. Has had general decline over the past 2 years. Was previously only able to minimally ambulate with a rolling walker. However has not been able to ambulate over this time frame. Noted to have been treated for UTI within the past 2 weeks. Has complete abx course. Family states urination frequency has significantly decreased. Has had some falls at home. Unsure of head trauma.CT shows no acute infarct, chest view shows no acute cardiopulmonary disease.    Clinical Impression   Spouse present during eval and reports a nursing helping patient with all adls. PT brittany session reports daughter (A)ing patient with adls. Question level of assistance available at home. Pt with progressive decline per chart and could benefit from hoyer lift if only one person transfer (A) available at home. Recommend Palliative consult. Family may need incr aide assistance due to incr total (A) decr mobility currently.  Ot to follow on trial bases to work on bed mobility and basic transfer to decr caregiver burden of care with adls.    Follow Up Recommendations  Other (comment) (to be determined- HHOT if to d/c home )    Equipment Recommendations  3 in 1 bedside comode;Wheelchair (measurements OT);Wheelchair cushion (measurements OT);Hospital bed;Other (comment) (hoyer lift)    Recommendations for Other Services Other (comment) (Palliative consult)     Precautions / Restrictions Precautions Precautions: Fall Precaution  Comments: daughter reports multiple falls at home  Restrictions Weight Bearing Restrictions: No      Mobility Bed Mobility Overal bed mobility: +2 for physical assistance;Needs Assistance Bed Mobility: Supine to Sit Rolling: Mod assist   Supine to sit: +2 for physical assistance;Mod assist     General bed mobility comments: pt with log rolling body movement. Pt is unable to segment movement to initiate  Transfers Overall transfer level: Needs assistance Equipment used: 2 person hand held assist Transfers: Sit to/from Stand;Stand Pivot Transfers Sit to Stand: +2 physical assistance;Max assist Stand pivot transfers: +2 physical assistance;Max assist       General transfer comment: Pt required (A) To power up into standing. pt once static standing unable to step toward chair. OT facilitating trunk rotation toward chair surface. Pt fatigues iwth static standing and requires incr (A). Pt sit<>Stand x2 for peri care and returning back to chair    Balance Overall balance assessment: Needs assistance Sitting-balance support: Feet supported;No upper extremity supported Sitting balance-Leahy Scale: Zero Sitting balance - Comments: pt with Rt lateral lean and no attempt to correct Postural control: Right lateral lean Standing balance support: Bilateral upper extremity supported;During functional activity Standing balance-Leahy Scale: Zero                              ADL Overall ADL's : Needs assistance/impaired Eating/Feeding: Minimal assistance;Sitting Eating/Feeding Details (indicate cue type and reason): pt required (A) to hold cup of water and to take small sips. pt sucking on edge of cup and OT had to remove cup from mouth to limit sips. Pt with small cough after first attempt.  OT helping control volume of liquid and able to drink without coughing     Upper Body Bathing: Total assistance   Lower Body Bathing: Total assistance                          General ADL Comments: Pt with simple request and response to questions during session. Spouse present and unable to give detailed information regarding ADLs PTA. Spouse reports nurse that saw her helped her. PGrenadattany reporting from daughter that daughter is caregiver. QUestion true (A) levels at home. Pt total (A) for all adls and total +2 for transfers. Family will need aid or hoyer lift for transfers.     Vision                     Perception     Praxis      Pertinent Vitals/Pain Pain Assessment: No/denies pain     Hand Dominance Right   Extremity/Trunk Assessment Upper Extremity Assessment Upper Extremity Assessment: Generalized weakness;Difficult to assess due to impaired cognition   Lower Extremity Assessment Lower Extremity Assessment: Defer to PT evaluation   Cervical / Trunk Assessment Cervical / Trunk Assessment: Other exceptions Cervical / Trunk Exceptions: flexed postural position with scapula elevation   Communication Communication Communication: Expressive difficulties   Cognition Arousal/Alertness: Awake/alert Behavior During Therapy: Flat affect Overall Cognitive Status: History of cognitive impairments - at baseline Area of Impairment: Following commands       Following Commands: Follows one step commands with increased time       General Comments: Pt with delayed response to all commands and decr initiation   General Comments       Exercises       Shoulder Instructions      Home Living Family/patient expects to be discharged to:: Private residence Living Arrangements: Spouse/significant other Available Help at Discharge: Family;Available 24 hours/day Type of Home: House Home Access: Stairs to enter [employer] of Steps: 1 Entrance Stairs-Rails: None Home Layout: Two level;Bed/bath upstairs Alternate Level Stairs-Number of Steps: 13             Home Equipment: Walker - 2 wheels;Cane - single point    Additional Comments: pt lives with daughter and stays primarily on 2nd floor at all times       Prior Functioning/Environment Level of Independence: Needs assistance  Gait / Transfers Assistance Needed: for over week pt has required physical (A) for all mobility and gt  ADL's / Homemaking Assistance Needed: total (A) per PT notes from daughter. Spouse present at OT eval reports a RN comes to the house and walks the patient to bathroom for bathing. Spouse unable to report if it is a sponge bath or transfer into a shower or tub   Comments: Spouse present and all adl questions asked reports "the nurse that sees her does it" Spouse does not mention daughter during reporting    OT Diagnosis: Generalized weakness;Cognitive deficits   OT Problem List: Decreased strength;Decreased activity tolerance;Impaired balance (sitting and/or standing);Decreased safety awareness;Decreased knowledge of use of DME or AE;Decreased knowledge of precautions;Decreased cognition;Decreased coordination   OT Treatment/Interventions: Self-care/ADL training;Therapeutic exercise;DME and/or AE instruction;Neuromuscular education;Therapeutic activities;Cognitive remediation/compensation;Patient/family education;Balance training    OT Goals(Current goals can be found in the care plan section) Acute Rehab OT Goals Patient Stated Goal: "cold water" OT Goal Formulation: Patient unable to participate in goal setting Time For Goal Achievement: 05/26/14 Potential to Achieve Goals: Fair  OT  Frequency: Min 2X/week   Barriers to D/C: Other (comment) (question (A) level for home)          Co-evaluation              End of Session Equipment Utilized During Treatment: Gait belt Nurse Communication: Mobility status;Precautions;Need for lift equipment  Activity Tolerance: Patient tolerated treatment well Patient left: in chair;with call bell/phone within reach;with chair alarm set   Time: [id number] OT Time Calculation  (min): 16 min Charges:  OT General Charges $OT Visit: 1 Procedure OT Evaluation $Initial OT Evaluation Tier I: 1 Procedure OT Treatments $Self Care/Home Management : 8-22 mins G-Codes:    Harolyn Rutherford 05/17/14, 9:44 AM Pager: [phone]

## 2014-05-12 NOTE — Progress Notes (Signed)
Talked to patient's spouse at the bedside about DCP; patient is to return home at discharge with [facility] with [place]; Mary with [facility] called for arrangements; Spouse stated that she has a wheelchair and a walker at home and not sure if they need a hospital bed but he will talk to his daughter today about if they want a hospital bed; Abelino Derrick RN,BSN,MHA [phone]

## 2014-05-12 NOTE — Progress Notes (Signed)
Hospital bed ordered through [facility]; B Yamilet Mcfayden RN,BSN,MHA [phone] 

## 2014-05-12 NOTE — Discharge Summary (Signed)
Physician Discharge Summary  Kristie Ellison:[id number] DOB: August 04, 1946 DOA: 05/10/2014  PCP: Georgann Housekeeper, MD  Admit date: 05/10/2014 Discharge date: 05/12/2014  Time spent: 35 minutes  Recommendations for Outpatient Follow-up:  1. Follow up with primary neurologist for further care of dementia.  2. Follow BP adjust medications as needed.  3. Repeat lipid panel, consider discontinue lipitor.  4. Follow Vitamin D level.  5.   Discharge Diagnoses:  Active Problems:   Weakness   Dehydration   Adult failure to thrive   Failure to thrive in adult   Malnutrition of moderate degree   Discharge Condition: stable.   Diet recommendation: Dysphagia 2 diet.   Filed Weights   05/10/14 2114 05/12/14 0500  Weight: 51.3 kg (113 lb 1.5 oz) 51.71 kg (114 lb)    History of present illness:  Chief Complaint: weakness, dehydration, failure to thrive  History of Present Illness:This is a 68 y.o. year old female with significant past medical history of end stage dementia and parkinsons disease, type 2 DM, HTN, CKD presenting with weakness, dehydration, failure to thrive. Family states that pt has had progressive decreased appetite, weight loss, and weakness over the past 2-3 weeks. Has baseline lewy body dementia and parkinsons. Has had general decline over the past 2 years. Was previously only able to minimally ambulate with a rolling walker. However has not been able to ambulate over this time frame. Noted to have been treated for UTI within the past 2 weeks. Has complete abx course. Family states urination frequency has significantly went down. Has had some falls at home. Unsure of head trauma. No reports of seizure activity. Has had general decrease in appetite and fluid intake. No reports of vomiting, diarrhea, SOB. No changes in medication. Was evaluated by home PT today with noted BP in 90s which is well off of baseline and was subsequently brought to the ER for further evaluation.  In ER, tmax  99.2, HR in 70s, BP in 100s-140s-improving w/ IVFs, Satting 90-100% on RA, mainly in upper 90s. CBC and BMET essentially WNL. UA and CXR negative for infection.    Hospital Course:  1-Weakness, deconditioning, FTT:  CT head negative. Vitamin D pending. TSH: 3.030, will check B 12.  MRI pending. If negative will plan to discharge patient today.  Neurology consulted.  Chest x ray negative, UA negative.  Continue with currents medication for dementia.   2-Parkinson, dementia:  -Continue with trazodone, Depakote, Zoloft, Clonazepam.  3-Malnutrition: nutrition consulted. Ensure, dysphagia 2 diet.  4-Dysphagia, dysphagia 2 diet.  5-Abdominal pain< epigastric with meals. This might be contributing to poor oral intake. GI recommending trial with protonix. Needs to follow up with primary gastroenterologist.   6-Hypertension: hold Cozaar, continue with atenolol.    Procedures:  none  Consultations:  GI  Neurology  Discharge Exam: Filed Vitals:   05/12/14 1044  BP: 128/63  Pulse: 72  Temp: 97.8 F (36.6 C)  Resp: 16    General: Alert, calm, making sounds.  Cardiovascular: S 1, S 2 RRR Respiratory: CTA  Discharge Instructions You were cared for by a hospitalist during your hospital stay. If you have any questions about your discharge medications or the care you received while you were in the hospital after you are discharged, you can call the unit and asked to speak with the hospitalist on call if the hospitalist that took care of you is not available. Once you are discharged, your primary care physician will handle any further medical issues. Please note  that NO REFILLS for any discharge medications will be authorized once you are discharged, as it is imperative that you return to your primary care physician (or establish a relationship with a primary care physician if you do not have one) for your aftercare needs so that they can reassess your need for medications and monitor your  lab values.   Current Discharge Medication List    START taking these medications   Details  feeding supplement, ENSURE COMPLETE, (ENSURE COMPLETE) LIQD Take 237 mLs by mouth 3 (three) times daily between meals. Qty: 30 Bottle, Refills: 1    pantoprazole (PROTONIX) 40 MG tablet Take 1 tablet (40 mg total) by mouth 2 (two) times daily. Qty: 60 tablet, Refills: 0      CONTINUE these medications which have NOT CHANGED   Details  aspirin EC 81 MG tablet Take 81 mg by mouth daily.     atenolol (TENORMIN) 50 MG tablet Take 50 mg by mouth daily.     atorvastatin (LIPITOR) 80 MG tablet Take 80 mg by mouth every evening.    carbidopa-levodopa (SINEMET IR) 25-100 MG per tablet Take 1 tablet by mouth 2 (two) times daily. Qty: 60 tablet, Refills: 12    clonazePAM (KLONOPIN) 0.5 MG tablet Take 1 tablet (0.5 mg total) by mouth 2 (two) times daily as needed for anxiety. Qty: 60 tablet, Refills: 5    divalproex (DEPAKOTE) 125 MG DR tablet Take 125 mg by mouth 2 (two) times daily.    sertraline (ZOLOFT) 25 MG tablet Take 50 mg by mouth daily.    traZODone (DESYREL) 50 MG tablet Take 50 mg by mouth at bedtime.       STOP taking these medications     losartan (COZAAR) 100 MG tablet        Allergies  Allergen Reactions  . Codeine Anxiety    Pt reports she goes crazy   Follow-up Information   Follow up with Georgann Housekeeper, MD In 1 week.   Specialty:  Internal Medicine   Contact information:   [address] [place] [address] [phone]        The results of significant diagnostics from this hospitalization (including imaging, microbiology, ancillary and laboratory) are listed below for reference.    Significant Diagnostic Studies: Dg Chest 2 View  05/11/2014   CLINICAL DATA:  Weakness and pneumonia  EXAM: CHEST  2 VIEW  COMPARISON:  05/10/2014  FINDINGS: The heart size and mediastinal contours are within normal limits. Both lungs are clear. The visualized  skeletal structures are unremarkable.  IMPRESSION: No active cardiopulmonary disease.   Electronically Signed   By: Alcide Clever M.D.   On: 05/11/2014 08:05   Ct Head Wo Contrast  05/10/2014   CLINICAL DATA:  Recent falls and weakness  EXAM: CT HEAD WITHOUT CONTRAST  TECHNIQUE: Contiguous axial images were obtained from the base of the skull through the vertex without intravenous contrast.  COMPARISON:  12/07/2013  FINDINGS: Bony calvarium is intact. No gross soft tissue abnormality is noted. Mild atrophic changes are seen commensurate the patient's given age. No acute hemorrhage, acute infarction or space-occupying mass lesion is seen.  IMPRESSION: No acute abnormality noted.   Electronically Signed   By: Alcide Clever M.D.   On: 05/10/2014 20:34   Mr Abdomen Wo Contrast  04/21/2014   CLINICAL DATA:  Cystic pancreatic tail lesion on abdominal CT performed 7 months ago for abdominal pain. Dementia.  EXAM: MRI ABDOMEN WITHOUT CONTRAST  TECHNIQUE:  Multiplanar multisequence MR imaging was performed without the administration of intravenous contrast.  COMPARISON:  Abdominal MRI 09/20/2013.  FINDINGS: Study was ordered without and with contrast. The patient was unable to complete the examination due to dementia. No contrast was administered.  1.6 cm cystic lesion within the pancreatic tail appears stable. No definite communication with the pancreatic duct is demonstrated. There are additional smaller cystic lesions within the pancreatic body and tail. The pancreatic duct is normal in caliber. There is no evidence of pancreas divisum. There are possible small gallstones. The gallbladder and biliary system otherwise appear normal.  Tiny hepatic cysts are noted. There are no worrisome hepatic lesions.  The spleen, adrenal glands and kidneys appear normal. No adenopathy or inflammatory changes are identified within the upper abdomen.  IMPRESSION: 1. Although the examination could not be completed and no contrast was  administered, the small cystic lesion within the pancreatic tail is unchanged from the CT of 7 months ago. This demonstrates no aggressive characteristics. 2. Tiny hepatic cysts and possible cholelithiasis. 3. No acute findings demonstrated. 4. Further stability of the pancreatic lesion could be documented with follow-up CT (likely better tolerated by this patient) in 6-12 months.   Electronically Signed   By: Roxy Horseman M.D.   On: 04/21/2014 13:39   Dg Chest Port 1 View  05/10/2014   CLINICAL DATA:  Altered mental status.  EXAM: PORTABLE CHEST - 1 VIEW  COMPARISON:  None.  FINDINGS: Mediastinum and hilar structures are normal. The lungs are clear. Cardiac structures are unremarkable. What appears to be ankylosis of the thoracic spine suggesting ankylosing spondylitis noted. Degenerative changes both shoulders.  IMPRESSION: 1.  No acute cardiopulmonary disease.  2.  Ankylosing spondylitis thoracic spine.   Electronically Signed   By: Maisie Fus  Register   On: 05/10/2014 17:10    Microbiology: Recent Results (from the past 240 hour(s))  URINE CULTURE     Status: None   Collection Time    05/10/14  4:26 PM      Result Value Ref Range Status   Specimen Description URINE, CATHETERIZED   Final   Special Requests NONE   Final   Culture  Setup Time     Final   Value: 05/10/2014 22:59     Performed at [facility] Count     Final   Value: NO GROWTH     Performed at [facility]   Culture     Final   Value: NO GROWTH     Performed at [facility]   Report Status 05/11/2014 FINAL   Final  CULTURE, BLOOD (ROUTINE X 2)     Status: None   Collection Time    05/10/14  4:39 PM      Result Value Ref Range Status   Specimen Description BLOOD ARM RIGHT   Final   Special Requests BOTTLES DRAWN AEROBIC AND ANAEROBIC 10CC   Final   Culture  Setup Time     Final   Value: 05/10/2014 20:39     Performed at [facility]   Culture     Final   Value:        BLOOD CULTURE  RECEIVED NO GROWTH TO DATE CULTURE WILL BE HELD FOR 5 DAYS BEFORE ISSUING A FINAL NEGATIVE REPORT     Performed at [facility]   Report Status PENDING   Incomplete  CULTURE, BLOOD (ROUTINE X 2)     Status: None  Collection Time    05/10/14  4:50 PM      Result Value Ref Range Status   Specimen Description BLOOD LEFT FOREARM   Final   Special Requests BOTTLES DRAWN AEROBIC AND ANAEROBIC 5CC   Final   Culture  Setup Time     Final   Value: 05/10/2014 20:39     Performed at [facility]   Culture     Final   Value:        BLOOD CULTURE RECEIVED NO GROWTH TO DATE CULTURE WILL BE HELD FOR 5 DAYS BEFORE ISSUING A FINAL NEGATIVE REPORT     Performed at [facility]   Report Status PENDING   Incomplete     Labs: Basic Metabolic Panel:  Recent Labs Lab 05/10/14 1617 05/10/14 1931 05/11/14 0626 05/12/14 0447  NA 140  --  144 143  K 3.8  --  3.8 3.6*  CL 103  --  110 111  CO2 27  --  22 24  GLUCOSE 114*  --  87 110*  BUN 26*  --  24* 25*  CREATININE 1.13* 0.93 0.90 0.94  CALCIUM 9.1  --  8.4 8.1*  PHOS  --  2.6  --   --    Liver Function Tests:  Recent Labs Lab 05/10/14 1617 05/11/14 0626 05/12/14 0447  AST ALT ALKPHOS 61 55 49  BILITOT 0.5 0.5 0.4  PROT 6.5 5.9* 5.1*  ALBUMIN 3.4* 3.0* 2.6*   No results found for this basename: LIPASE, AMYLASE,  in the last 168 hours No results found for this basename: AMMONIA,  in the last 168 hours CBC:  Recent Labs Lab 05/10/14 1617 05/10/14 1931 05/11/14 0626 05/12/14 0447  WBC 4.6 5.2 4.3 3.3*  NEUTROABS 2.7  --  2.5 1.6*  HGB 12.0 12.0 10.5* 9.3*  HCT 36.7 36.3 33.0* 28.7*  MCV 89.3 90.1 89.7 89.1  PLT 165 168 164 137*   Cardiac Enzymes: No results found for this basename: CKTOTAL, CKMB, CKMBINDEX, TROPONINI,  in the last 168 hours BNP: BNP (last 3 results)  Recent Labs  05/10/14 1932  PROBNP 4479.0*   CBG:  Recent Labs Lab 05/11/14 0620 05/11/14 1249  05/11/14 1749 05/12/14 0033  GLUCAP 85 97 146* 135*       Signed:  Desmon Hitchner A  [facility] 05/12/2014, 12:16 PM

## 2014-05-12 NOTE — Progress Notes (Signed)
Called MRI to see when patient will go down for procedure, was told that she is on the list and that they are backed up.

## 2014-05-13 LAB — CBC WITH DIFFERENTIAL/PLATELET
Basophils Absolute: 0 10*3/uL (ref 0.0–0.1)
Basophils Relative: 1 % (ref 0–1)
EOS PCT: 1 % (ref 0–5)
Eosinophils Absolute: 0.1 10*3/uL (ref 0.0–0.7)
HCT: 30 % — ABNORMAL LOW (ref 36.0–46.0)
Hemoglobin: 9.7 g/dL — ABNORMAL LOW (ref 12.0–15.0)
LYMPHS ABS: 1.1 10*3/uL (ref 0.7–4.0)
Lymphocytes Relative: 30 % (ref 12–46)
MCH: 29.7 pg (ref 26.0–34.0)
MCHC: 32.3 g/dL (ref 30.0–36.0)
MCV: 91.7 fL (ref 78.0–100.0)
Monocytes Absolute: 0.4 10*3/uL (ref 0.1–1.0)
Monocytes Relative: 10 % (ref 3–12)
Neutro Abs: 2.1 10*3/uL (ref 1.7–7.7)
Neutrophils Relative %: 58 % (ref 43–77)
Platelets: 145 10*3/uL — ABNORMAL LOW (ref 150–400)
RBC: 3.27 MIL/uL — AB (ref 3.87–5.11)
RDW: 13.6 % (ref 11.5–15.5)
WBC: 3.6 10*3/uL — ABNORMAL LOW (ref 4.0–10.5)

## 2014-05-13 LAB — COMPREHENSIVE METABOLIC PANEL
ALT: 9 U/L (ref 0–35)
AST: 23 U/L (ref 0–37)
Albumin: 2.7 g/dL — ABNORMAL LOW (ref 3.5–5.2)
Alkaline Phosphatase: 51 U/L (ref 39–117)
Anion gap: 9 (ref 5–15)
BUN: 21 mg/dL (ref 6–23)
CALCIUM: 8.5 mg/dL (ref 8.4–10.5)
CO2: 25 meq/L (ref 19–32)
Chloride: 108 mEq/L (ref 96–112)
Creatinine, Ser: 0.79 mg/dL (ref 0.50–1.10)
GFR calc Af Amer: >90 mL/min (ref >90)
GFR calc non Af Amer: 83 mL/min — ABNORMAL LOW (ref >90)
Glucose, Bld: 116 mg/dL — ABNORMAL HIGH (ref 70–99)
POTASSIUM: 3.5 meq/L — AB (ref 3.7–5.3)
SODIUM: 142 meq/L (ref 137–147)
Total Bilirubin: 0.3 mg/dL (ref 0.3–1.2)
Total Protein: 5.3 g/dL — ABNORMAL LOW (ref 6.0–8.3)

## 2014-05-13 MED ORDER — POTASSIUM CHLORIDE CRYS ER 20 MEQ PO TBCR: 20.0000 meq | EXTENDED_RELEASE_TABLET | Freq: Every day | ORAL | Status: AC

## 2014-05-13 MED ORDER — POTASSIUM CHLORIDE 20 MEQ PO PACK: 20.0000 meq | PACK | Freq: Every day | ORAL | Status: DC

## 2014-05-13 MED ORDER — POTASSIUM CHLORIDE CRYS ER 20 MEQ PO TBCR
20.0000 meq | EXTENDED_RELEASE_TABLET | Freq: Every day | ORAL | Status: DC
  Administered 2014-05-13: 20 meq via ORAL
  Filled 2014-05-13: qty 1

## 2014-05-13 MED ORDER — POTASSIUM CHLORIDE 20 MEQ PO PACK
20.0000 meq | PACK | Freq: Every day | ORAL | Status: DC
  Filled 2014-05-13: qty 1

## 2014-05-13 MED ORDER — ADULT MULTIVITAMIN LIQUID CH: 5.0000 mL | Freq: Every day | ORAL | Status: DC

## 2014-05-13 NOTE — Discharge Instructions (Signed)
Dysphagia 2 diet 

## 2014-05-13 NOTE — Discharge Summary (Signed)
Physician Discharge Summary  Kristie Ellison EXB:[id number] DOB: 11/15/1945 DOA: 05/10/2014  PCP: Georgann Housekeeper, MD  Admit date: 05/10/2014 Discharge date: 05/13/2014  Time spent: 35 minutes  Recommendations for Outpatient Follow-up:  1. Follow up with primary neurologist for further care of dementia.  2. Follow BP adjust medications as needed.  3. Repeat lipid panel, consider discontinue lipitor.  4. Follow Vitamin D level.  5.   Discharge Diagnoses:    FTT   Weakness   Dehydration   Adult failure to thrive   Failure to thrive in adult   Malnutrition of moderate degree   Discharge Condition: stable.   Diet recommendation: Dysphagia 2 diet.   Filed Weights   05/10/14 2114 05/12/14 0500  Weight: 51.3 kg (113 lb 1.5 oz) 51.71 kg (114 lb)    History of present illness:  Chief Complaint: weakness, dehydration, failure to thrive  History of Present Illness:This is a 68 y.o. year old female with significant past medical history of end stage dementia and parkinsons disease, type 2 DM, HTN, CKD presenting with weakness, dehydration, failure to thrive. Family states that pt has had progressive decreased appetite, weight loss, and weakness over the past 2-3 weeks. Has baseline lewy body dementia and parkinsons. Has had general decline over the past 2 years. Was previously only able to minimally ambulate with a rolling walker. However has not been able to ambulate over this time frame. Noted to have been treated for UTI within the past 2 weeks. Has complete abx course. Family states urination frequency has significantly went down. Has had some falls at home. Unsure of head trauma. No reports of seizure activity. Has had general decrease in appetite and fluid intake. No reports of vomiting, diarrhea, SOB. No changes in medication. Was evaluated by home PT today with noted BP in 90s which is well off of baseline and was subsequently brought to the ER for further evaluation.  In ER, tmax 99.2, HR in  70s, BP in 100s-140s-improving w/ IVFs, Satting 90-100% on RA, mainly in upper 90s. CBC and BMET essentially WNL. UA and CXR negative for infection.    Hospital Course:  1-Weakness, deconditioning, FTT:  CT head negative. Vitamin D pending. TSH: 3.030, will check B 12.  MRI pending. If negative will plan to discharge patient today.  Neurology consulted.  Chest x ray negative, UA negative.  Continue with currents medication for dementia.  Patient at Baseline for dementia.   2-Parkinson, dementia:  -Continue with trazodone, Depakote, Zoloft, Clonazepam.  3-Malnutrition: nutrition consulted. Ensure, dysphagia 2 diet.  4-Dysphagia, dysphagia 2 diet.  5-Abdominal pain< epigastric with meals. This might be contributing to poor oral intake. GI recommending trial with protonix. Needs to follow up with primary gastroenterologist.  Feels pain is better.  6-Hypertension: hold Cozaar, continue with atenolol.    Procedures:  none  Consultations:  GI  Neurology  Discharge Exam: Filed Vitals:   05/13/14 1008  BP: 129/76  Pulse: 80  Temp: 97.5 F (36.4 C)  Resp: 18    General: Alert, calm, making sounds.  Cardiovascular: S 1, S 2 RRR Respiratory: CTA  Discharge Instructions You were cared for by a hospitalist during your hospital stay. If you have any questions about your discharge medications or the care you received while you were in the hospital after you are discharged, you can call the unit and asked to speak with the hospitalist on call if the hospitalist that took care of you is not available. Once you are discharged, your  primary care physician will handle any further medical issues. Please note that NO REFILLS for any discharge medications will be authorized once you are discharged, as it is imperative that you return to your primary care physician (or establish a relationship with a primary care physician if you do not have one) for your aftercare needs so that they can  reassess your need for medications and monitor your lab values.  Discharge Instructions   Increase activity slowly    Complete by:  As directed           Discharge Medication List as of 05/13/2014 10:54 AM    START taking these medications   Details  feeding supplement, ENSURE COMPLETE, (ENSURE COMPLETE) LIQD Take 237 mLs by mouth 3 (three) times daily between meals., Starting 05/12/2014, Until Discontinued, Print    Multiple Vitamin (MULTIVITAMIN) LIQD Take 5 mLs by mouth daily., Starting 05/13/2014, Until Discontinued, Print    pantoprazole (PROTONIX) 40 MG tablet Take 1 tablet (40 mg total) by mouth 2 (two) times daily., Starting 05/12/2014, Until Discontinued, Print    potassium chloride SA (K-DUR,KLOR-CON) 20 MEQ tablet Take 1 tablet (20 mEq total) by mouth daily., Starting 05/13/2014, Until Discontinued, Print      CONTINUE these medications which have NOT CHANGED   Details  aspirin EC 81 MG tablet Take 81 mg by mouth daily. , Until Discontinued, Historical Med    atenolol (TENORMIN) 50 MG tablet Take 50 mg by mouth daily. , Until Discontinued, Historical Med    atorvastatin (LIPITOR) 80 MG tablet Take 80 mg by mouth every evening., Until Discontinued, Historical Med    carbidopa-levodopa (SINEMET IR) 25-100 MG per tablet Take 1 tablet by mouth 2 (two) times daily., Starting 01/02/2014, Until Discontinued, Normal    clonazePAM (KLONOPIN) 0.5 MG tablet Take 1 tablet (0.5 mg total) by mouth 2 (two) times daily as needed for anxiety., Starting 05/03/2014, Until Discontinued, Print    divalproex (DEPAKOTE) 125 MG DR tablet Take 125 mg by mouth 2 (two) times daily., Until Discontinued, Historical Med    sertraline (ZOLOFT) 25 MG tablet Take 50 mg by mouth daily., Starting 04/11/2014, Until Discontinued, Historical Med    traZODone (DESYREL) 50 MG tablet Take 50 mg by mouth at bedtime. , Starting 03/20/2014, Until Discontinued, Historical Med      STOP taking these medications     losartan  (COZAAR) 100 MG tablet        Allergies  Allergen Reactions  . Codeine Anxiety    Pt reports she goes crazy   Follow-up Information   Follow up with Georgann Housekeeper, MD In 1 week.   Specialty:  Internal Medicine   Contact information:   [address], [address] [place] [address] [phone]        The results of significant diagnostics from this hospitalization (including imaging, microbiology, ancillary and laboratory) are listed below for reference.    Significant Diagnostic Studies: Dg Chest 2 View  05/11/2014   CLINICAL DATA:  Weakness and pneumonia  EXAM: CHEST  2 VIEW  COMPARISON:  05/10/2014  FINDINGS: The heart size and mediastinal contours are within normal limits. Both lungs are clear. The visualized skeletal structures are unremarkable.  IMPRESSION: No active cardiopulmonary disease.   Electronically Signed   By: Alcide Clever M.D.   On: 05/11/2014 08:05   Ct Head Wo Contrast  05/10/2014   CLINICAL DATA:  Recent falls and weakness  EXAM: CT HEAD WITHOUT CONTRAST  TECHNIQUE: Contiguous axial images were  obtained from the base of the skull through the vertex without intravenous contrast.  COMPARISON:  12/07/2013  FINDINGS: Bony calvarium is intact. No gross soft tissue abnormality is noted. Mild atrophic changes are seen commensurate the patient's given age. No acute hemorrhage, acute infarction or space-occupying mass lesion is seen.  IMPRESSION: No acute abnormality noted.   Electronically Signed   By: Alcide Clever M.D.   On: 05/10/2014 20:34   Mr Abdomen Wo Contrast  04/21/2014   CLINICAL DATA:  Cystic pancreatic tail lesion on abdominal CT performed 7 months ago for abdominal pain. Dementia.  EXAM: MRI ABDOMEN WITHOUT CONTRAST  TECHNIQUE: Multiplanar multisequence MR imaging was performed without the administration of intravenous contrast.  COMPARISON:  Abdominal MRI 09/20/2013.  FINDINGS: Study was ordered without and with contrast. The patient was unable to  complete the examination due to dementia. No contrast was administered.  1.6 cm cystic lesion within the pancreatic tail appears stable. No definite communication with the pancreatic duct is demonstrated. There are additional smaller cystic lesions within the pancreatic body and tail. The pancreatic duct is normal in caliber. There is no evidence of pancreas divisum. There are possible small gallstones. The gallbladder and biliary system otherwise appear normal.  Tiny hepatic cysts are noted. There are no worrisome hepatic lesions.  The spleen, adrenal glands and kidneys appear normal. No adenopathy or inflammatory changes are identified within the upper abdomen.  IMPRESSION: 1. Although the examination could not be completed and no contrast was administered, the small cystic lesion within the pancreatic tail is unchanged from the CT of 7 months ago. This demonstrates no aggressive characteristics. 2. Tiny hepatic cysts and possible cholelithiasis. 3. No acute findings demonstrated. 4. Further stability of the pancreatic lesion could be documented with follow-up CT (likely better tolerated by this patient) in 6-12 months.   Electronically Signed   By: Roxy Horseman M.D.   On: 04/21/2014 13:39   Dg Chest Port 1 View  05/10/2014   CLINICAL DATA:  Altered mental status.  EXAM: PORTABLE CHEST - 1 VIEW  COMPARISON:  None.  FINDINGS: Mediastinum and hilar structures are normal. The lungs are clear. Cardiac structures are unremarkable. What appears to be ankylosis of the thoracic spine suggesting ankylosing spondylitis noted. Degenerative changes both shoulders.  IMPRESSION: 1.  No acute cardiopulmonary disease.  2.  Ankylosing spondylitis thoracic spine.   Electronically Signed   By: Maisie Fus  Register   On: 05/10/2014 17:10    Microbiology: Recent Results (from the past 240 hour(s))  URINE CULTURE     Status: None   Collection Time    05/10/14  4:26 PM      Result Value Ref Range Status   Specimen Description  URINE, CATHETERIZED   Final   Special Requests NONE   Final   Culture  Setup Time     Final   Value: 05/10/2014 22:59     Performed at [facility] Count     Final   Value: NO GROWTH     Performed at [facility]   Culture     Final   Value: NO GROWTH     Performed at [facility]   Report Status 05/11/2014 FINAL   Final  CULTURE, BLOOD (ROUTINE X 2)     Status: None   Collection Time    05/10/14  4:39 PM      Result Value Ref Range Status   Specimen Description BLOOD ARM RIGHT  Final   Special Requests BOTTLES DRAWN AEROBIC AND ANAEROBIC 10CC   Final   Culture  Setup Time     Final   Value: 05/10/2014 20:39     Performed at [facility]   Culture     Final   Value:        BLOOD CULTURE RECEIVED NO GROWTH TO DATE CULTURE WILL BE HELD FOR 5 DAYS BEFORE ISSUING A FINAL NEGATIVE REPORT     Performed at [facility]   Report Status PENDING   Incomplete  CULTURE, BLOOD (ROUTINE X 2)     Status: None   Collection Time    05/10/14  4:50 PM      Result Value Ref Range Status   Specimen Description BLOOD LEFT FOREARM   Final   Special Requests BOTTLES DRAWN AEROBIC AND ANAEROBIC 5CC   Final   Culture  Setup Time     Final   Value: 05/10/2014 20:39     Performed at [facility]   Culture     Final   Value:        BLOOD CULTURE RECEIVED NO GROWTH TO DATE CULTURE WILL BE HELD FOR 5 DAYS BEFORE ISSUING A FINAL NEGATIVE REPORT     Performed at [facility]   Report Status PENDING   Incomplete     Labs: Basic Metabolic Panel:  Recent Labs Lab 05/10/14 1617 05/10/14 1931 05/11/14 0626 05/12/14 0447 05/13/14 0454  NA 140  --  144 143 142  K 3.8  --  3.8 3.6* 3.5*  CL 103  --  110 111 108  CO2 27  --  GLUCOSE 114*  --  87 110* 116*  BUN 26*  --  24* 25* 21  CREATININE 1.13* 0.93 0.90 0.94 0.79  CALCIUM 9.1  --  8.4 8.1* 8.5  PHOS  --  2.6  --   --   --    Liver Function Tests:  Recent Labs Lab  05/10/14 1617 05/11/14 0626 05/12/14 0447 05/13/14 0454  AST ALT ALKPHOS 61 55 49 51  BILITOT 0.5 0.5 0.4 0.3  PROT 6.5 5.9* 5.1* 5.3*  ALBUMIN 3.4* 3.0* 2.6* 2.7*   No results found for this basename: LIPASE, AMYLASE,  in the last 168 hours No results found for this basename: AMMONIA,  in the last 168 hours CBC:  Recent Labs Lab 05/10/14 1617 05/10/14 1931 05/11/14 0626 05/12/14 0447 05/13/14 0454  WBC 4.6 5.2 4.3 3.3* 3.6*  NEUTROABS 2.7  --  2.5 1.6* 2.1  HGB 12.0 12.0 10.5* 9.3* 9.7*  HCT 36.7 36.3 33.0* 28.7* 30.0*  MCV 89.3 90.1 89.7 89.1 91.7  PLT 165 168 164 137* 145*   Cardiac Enzymes: No results found for this basename: CKTOTAL, CKMB, CKMBINDEX, TROPONINI,  in the last 168 hours BNP: BNP (last 3 results)  Recent Labs  05/10/14 1932  PROBNP 4479.0*   CBG:  Recent Labs Lab 05/11/14 0620 05/11/14 1249 05/11/14 1749 05/12/14 0033  GLUCAP 85 97 146* 135*       Signed:  Garey Alleva A  [facility] 05/13/2014, 4:25 PM

## 2014-05-13 NOTE — Progress Notes (Signed)
Patient is been d/c home, d/c instruction given to the daughter and she verbalized understanding. Condition fair.

## 2014-05-14 LAB — VITAMIN D 1,25 DIHYDROXY
Vitamin D 1, 25 (OH)2 Total: 28 pg/mL (ref 18–72)
Vitamin D3 1, 25 (OH)2: 28 pg/mL

## 2014-05-16 ENCOUNTER — Telehealth: Payer: Self-pay | Admitting: Neurology

## 2014-05-16 LAB — CULTURE, BLOOD (ROUTINE X 2)
CULTURE: NO GROWTH
Culture: NO GROWTH

## 2014-05-16 NOTE — Telephone Encounter (Signed)
Go ahead with orders. -VRP

## 2014-05-16 NOTE — Telephone Encounter (Signed)
Kristie Ellison with [facility] home [facility] called for a verbal order to resume nursing and PT care.  Since the patient was in the hospital they need orders again to resume care.

## 2014-05-16 NOTE — Evaluation (Signed)
Supervised and reviewed by Carlon Chaloux MA CCC-SLP  

## 2014-05-16 NOTE — Telephone Encounter (Signed)
Spoke to [place] with [facility] and relayed verbal orders to resume nursing and PT home care.

## 2014-05-18 ENCOUNTER — Telehealth: Payer: Self-pay | Admitting: Diagnostic Neuroimaging

## 2014-05-18 NOTE — Telephone Encounter (Signed)
Patient's daughter calling stating that she needs a letter written on [employer] letterhead stating her mother is incapable of handling her daily life  finances and what her diagnoses are. Best call back number is [phone].

## 2014-05-19 NOTE — Telephone Encounter (Signed)
Please write it and I'll sign it. Thanks, [employer]

## 2014-05-22 NOTE — Telephone Encounter (Signed)
Called daughter and made aware letter ready. Daughter requested letter mailed. Sent letter.

## 2014-05-24 ENCOUNTER — Telehealth: Payer: Self-pay | Admitting: Diagnostic Neuroimaging

## 2014-05-24 NOTE — Telephone Encounter (Signed)
Patient's daughter [name] returning call to [place], please call and advise.

## 2014-05-24 NOTE — Telephone Encounter (Signed)
Patient's daughter would like to know if Trazodone 50 mg 1 tab @ hs be increased to bid. She says patient is staying awake and active all day long.

## 2014-05-25 ENCOUNTER — Telehealth: Payer: Self-pay | Admitting: Diagnostic Neuroimaging

## 2014-05-25 NOTE — Telephone Encounter (Signed)
I called and relayed to [facility] with [facility] that pcp would be the best one to handle this per NP.  I gave her Dr. Donette Larry, as her pcp.  She will call.

## 2014-05-25 NOTE — Telephone Encounter (Signed)
No. Will setup geriatric psychiatry referral. -VRP

## 2014-05-25 NOTE — Telephone Encounter (Signed)
Yes, PCP to handle wound orders.

## 2014-05-25 NOTE — Telephone Encounter (Signed)
Kisha from [facility] calling to state that patient has a stage 2 wound to coccyx and she is trying to obtain wound orders. Please return call and advise.

## 2014-05-25 NOTE — Telephone Encounter (Signed)
BJ with [facility] @ [phone], calling to inform Dr. Sharen Hint Service appointment with patient has been moved to next week.  Please feel free to call with any questions.

## 2014-05-26 NOTE — Telephone Encounter (Addendum)
Spoke to daughter. She says she already has patient scheduled to see psychiatrist on 06/08/14.

## 2014-05-29 ENCOUNTER — Telehealth: Payer: Self-pay | Admitting: Diagnostic Neuroimaging

## 2014-05-29 NOTE — Telephone Encounter (Signed)
Left message for return call on which type of home health visit she is requesting, nursing, PT, etc.

## 2014-05-29 NOTE — Telephone Encounter (Signed)
Clydie Braun with [facility] @ [phone], requesting verbal order for 1 visit this week for Home Health.  Please call and advise.

## 2014-05-30 ENCOUNTER — Telehealth: Payer: Self-pay | Admitting: Neurology

## 2014-05-30 NOTE — Telephone Encounter (Signed)
Spoke with Aldean Jewett the social worker with [facility] home health.  She is requesting a verbal order for two more follow up visits for community resources and family, emotional support.

## 2014-05-30 NOTE — Telephone Encounter (Signed)
Agree. -VRP 

## 2014-06-02 NOTE — Telephone Encounter (Signed)
Left another message for Clydie Braun that if she is still in need of a verbal order, I believe for speech, to return our call.  Will close encounter since we haven't heard otherwise.

## 2014-06-08 ENCOUNTER — Telehealth: Payer: Self-pay | Admitting: *Deleted

## 2014-06-08 ENCOUNTER — Other Ambulatory Visit: Payer: Self-pay | Admitting: Nurse Practitioner

## 2014-06-08 MED ORDER — SERTRALINE HCL 50 MG PO TABS: 50.0000 mg | ORAL_TABLET | Freq: Every day | ORAL | Status: AC

## 2014-06-08 NOTE — Telephone Encounter (Signed)
I have revised Rx for 50 mg tablets, 1 daily. Sent to their [facility]. Please notify patient's family.

## 2014-06-08 NOTE — Telephone Encounter (Signed)
Patient's daughter Elonda HuskyCassandra calling to state that Sertraline was increased to 50 mg at last office visit with NP LL on 05/03/14, pharmacy states that it was still 25 mg, please correct medication 50 mg daily, patient will be out in the next few days.

## 2014-06-09 NOTE — Telephone Encounter (Signed)
Called patient and informed her that prescription has been corrected and sent to the pharmacy, patient verbalized understanding.

## 2014-07-13 ENCOUNTER — Encounter (HOSPITAL_COMMUNITY): Payer: Self-pay | Admitting: *Deleted

## 2014-07-13 ENCOUNTER — Emergency Department (HOSPITAL_COMMUNITY)
Admission: EM | Admit: 2014-07-13 | Discharge: 2014-07-13 | Disposition: A | Discharge status: Home / Self Care | Payer: Medicare Other | Attending: Emergency Medicine | Admitting: Emergency Medicine

## 2014-07-13 ENCOUNTER — Emergency Department (HOSPITAL_COMMUNITY): Payer: Medicare Other

## 2014-07-13 DIAGNOSIS — E119 Type 2 diabetes mellitus without complications: Secondary | ICD-10-CM | POA: Diagnosis not present

## 2014-07-13 DIAGNOSIS — E860 Dehydration: Secondary | ICD-10-CM

## 2014-07-13 DIAGNOSIS — E785 Hyperlipidemia, unspecified: Secondary | ICD-10-CM | POA: Diagnosis not present

## 2014-07-13 DIAGNOSIS — Z79899 Other long term (current) drug therapy: Secondary | ICD-10-CM | POA: Diagnosis not present

## 2014-07-13 DIAGNOSIS — R5381 Other malaise: Secondary | ICD-10-CM | POA: Insufficient documentation

## 2014-07-13 DIAGNOSIS — Z7982 Long term (current) use of aspirin: Secondary | ICD-10-CM | POA: Diagnosis not present

## 2014-07-13 DIAGNOSIS — G3183 Dementia with Lewy bodies: Secondary | ICD-10-CM | POA: Insufficient documentation

## 2014-07-13 DIAGNOSIS — I10 Essential (primary) hypertension: Secondary | ICD-10-CM | POA: Insufficient documentation

## 2014-07-13 DIAGNOSIS — R4182 Altered mental status, unspecified: Secondary | ICD-10-CM | POA: Diagnosis present

## 2014-07-13 DIAGNOSIS — G20 Parkinson's disease: Secondary | ICD-10-CM

## 2014-07-13 LAB — CBC WITH DIFFERENTIAL/PLATELET
BASOS ABS: 0 10*3/uL (ref 0.0–0.1)
Basophils Relative: 1 % (ref 0–1)
EOS ABS: 0 10*3/uL (ref 0.0–0.7)
EOS PCT: 1 % (ref 0–5)
HCT: 40.4 % (ref 36.0–46.0)
Hemoglobin: 13.2 g/dL (ref 12.0–15.0)
Lymphocytes Relative: 33 % (ref 12–46)
Lymphs Abs: 1.7 10*3/uL (ref 0.7–4.0)
MCH: 28.9 pg (ref 26.0–34.0)
MCHC: 32.7 g/dL (ref 30.0–36.0)
MCV: 88.4 fL (ref 78.0–100.0)
Monocytes Absolute: 0.4 10*3/uL (ref 0.1–1.0)
Monocytes Relative: 8 % (ref 3–12)
Neutro Abs: 3 10*3/uL (ref 1.7–7.7)
Neutrophils Relative %: 57 % (ref 43–77)
Platelets: 140 10*3/uL — ABNORMAL LOW (ref 150–400)
RBC: 4.57 MIL/uL (ref 3.87–5.11)
RDW: 12.1 % (ref 11.5–15.5)
WBC: 5.2 10*3/uL (ref 4.0–10.5)

## 2014-07-13 LAB — COMPREHENSIVE METABOLIC PANEL
ALT: 16 U/L (ref 0–35)
AST: 36 U/L (ref 0–37)
Albumin: 3.7 g/dL (ref 3.5–5.2)
Alkaline Phosphatase: 87 U/L (ref 39–117)
Anion gap: 13 (ref 5–15)
BUN: 33 mg/dL — ABNORMAL HIGH (ref 6–23)
CALCIUM: 10 mg/dL (ref 8.4–10.5)
CO2: 26 mEq/L (ref 19–32)
CREATININE: 0.94 mg/dL (ref 0.50–1.10)
Chloride: 106 mEq/L (ref 96–112)
GFR calc Af Amer: 70 mL/min — ABNORMAL LOW (ref >90)
GFR calc non Af Amer: 61 mL/min — ABNORMAL LOW (ref >90)
GLUCOSE: 144 mg/dL — AB (ref 70–99)
Potassium: 3.5 mEq/L — ABNORMAL LOW (ref 3.7–5.3)
Sodium: 145 mEq/L (ref 137–147)
Total Bilirubin: 0.4 mg/dL (ref 0.3–1.2)
Total Protein: 7.4 g/dL (ref 6.0–8.3)

## 2014-07-13 LAB — URINALYSIS, ROUTINE W REFLEX MICROSCOPIC
Bilirubin Urine: NEGATIVE
Glucose, UA: NEGATIVE mg/dL
Hgb urine dipstick: NEGATIVE
Ketones, ur: 15 mg/dL — AB
Leukocytes, UA: NEGATIVE
Nitrite: NEGATIVE
Protein, ur: NEGATIVE mg/dL
Specific Gravity, Urine: 1.033 — ABNORMAL HIGH (ref 1.005–1.030)
UROBILINOGEN UA: 1 mg/dL (ref 0.0–1.0)
pH: 6 (ref 5.0–8.0)

## 2014-07-13 LAB — I-STAT CG4 LACTIC ACID, ED
LACTIC ACID, VENOUS: 1.03 mmol/L (ref 0.5–2.2)
LACTIC ACID, VENOUS: 1.65 mmol/L (ref 0.5–2.2)
Lactic Acid, Venous: 1.14 mmol/L (ref 0.5–2.2)

## 2014-07-13 MED ORDER — DEXTROSE 5 % IV SOLN
500.0000 mg | Freq: Once | INTRAVENOUS | Status: AC
  Administered 2014-07-13: 500 mg via INTRAVENOUS
  Filled 2014-07-13: qty 500

## 2014-07-13 MED ORDER — SODIUM CHLORIDE 0.9 % IV BOLUS (SEPSIS)
1000.0000 mL | Freq: Once | INTRAVENOUS | Status: AC
Start: 2014-07-13 — End: 2014-07-13
  Administered 2014-07-13: 1000 mL via INTRAVENOUS

## 2014-07-13 MED ORDER — DEXTROSE 5 % IV SOLN
1.0000 g | Freq: Once | INTRAVENOUS | Status: AC
  Administered 2014-07-13: 1 g via INTRAVENOUS
  Filled 2014-07-13: qty 10

## 2014-07-13 MED ORDER — SODIUM CHLORIDE 0.9 % IV BOLUS (SEPSIS)
500.0000 mL | INTRAVENOUS | Status: AC
  Administered 2014-07-13: 500 mL via INTRAVENOUS

## 2014-07-13 NOTE — ED Notes (Signed)
pt daughter sts pt has had a decrease in appertite and does not drink fluids, sts she  has been incontient for 3 weeks,  pt has had an increase in sweating and low BP for 2 weeks. Pt has hx of HTN, Dementia, Parkinson's.

## 2014-07-13 NOTE — ED Notes (Signed)
Pt in with family reporting decreased activity and decreased PO intake for the last two weeks, pt is normally alert but confused, pt is lethargic in triage, BP has been low at home unsure of fever but states patient has been getting diaphoretic, family reports they have noted a cough

## 2014-07-13 NOTE — ED Notes (Signed)
X-ray at bedside

## 2014-07-13 NOTE — ED Provider Notes (Signed)
CSN: [id number]     Arrival date & time 07/13/14  1447 History   First MD Initiated Contact with Patient 07/13/14 1507     Chief Complaint  Patient presents with  . Altered Mental Status     (Consider location/radiation/quality/duration/timing/severity/associated sxs/prior Treatment) HPI   Kristie Ellison is a 68 y.o. femaleHere with her daughter who states that the patient has been not eating and drinking well recently and in cognitive urine for 3 weeks.  Is also having periods of sweating and low blood pressure.  The patient is unable to contribute to history.  Level V Caveat- Altered mental status    Past Medical History  Diagnosis Date  . Hypertension   . Dementia   . Parkinson disease   . Diabetes mellitus without complication     type 2  . Hypoglycemia 11/2012  . Hyperlipemia    History reviewed. No pertinent past surgical history. Family History  Problem Relation Age of Onset  . Heart disease Mother   . Heart disease Father    History  Substance Use Topics  . Smoking status: Never Smoker   . Smokeless tobacco: Never Used  . Alcohol Use: No   OB History    No data available     Review of Systems  Unable to perform ROS     Allergies  Codeine  Home Medications   Prior to Admission medications   Medication Sig Start Date End Date Taking? Authorizing Provider  aspirin EC 81 MG tablet Take 81 mg by mouth daily.    Yes Historical Provider, MD  atenolol (TENORMIN) 50 MG tablet Take 50 mg by mouth daily.    Yes Historical Provider, MD  atorvastatin (LIPITOR) 80 MG tablet Take 80 mg by mouth every evening.   Yes Historical Provider, MD  carbidopa-levodopa (SINEMET IR) 25-100 MG per tablet Take 1 tablet by mouth 2 (two) times daily. 01/02/14  Yes Suanne Marker, MD  clonazePAM (KLONOPIN) 0.5 MG tablet Take 1 tablet (0.5 mg total) by mouth 2 (two) times daily as needed for anxiety. 05/03/14  Yes Ronal Fear, NP  feeding supplement, ENSURE COMPLETE, (ENSURE  COMPLETE) LIQD Take 237 mLs by mouth 3 (three) times daily between meals. 05/12/14  Yes Belkys A Regalado, MD  polyethylene glycol (MIRALAX / GLYCOLAX) packet Take 17 g by mouth daily as needed for mild constipation or moderate constipation.   Yes Historical Provider, MD  potassium chloride SA (K-DUR,KLOR-CON) 20 MEQ tablet Take 1 tablet (20 mEq total) by mouth daily. 05/13/14  Yes Belkys A Regalado, MD  sertraline (ZOLOFT) 50 MG tablet Take 1 tablet (50 mg total) by mouth daily. 06/08/14  Yes Ronal Fear, NP  divalproex (DEPAKOTE) 125 MG DR tablet Take 125 mg by mouth 2 (two) times daily.    Historical Provider, MD  Multiple Vitamin (MULTIVITAMIN) LIQD Take 5 mLs by mouth daily. 05/13/14   Belkys A Regalado, MD  pantoprazole (PROTONIX) 40 MG tablet Take 1 tablet (40 mg total) by mouth 2 (two) times daily. 05/12/14   Belkys A Regalado, MD  traZODone (DESYREL) 50 MG tablet Take 50 mg by mouth at bedtime.  03/20/14   Historical Provider, MD   BP 155/85 mmHg  Pulse 88  Temp(Src) 98.7 F (37.1 C) (Rectal)  Resp 25  Ht 5' (1.524 m)  Wt 112 lb (50.803 kg)  BMI 21.87 kg/m2  SpO2 99% Physical Exam  Constitutional: She appears well-developed.  Elderly, frail  HENT:  Head: Normocephalic and  atraumatic.  Right Ear: External ear normal.  Left Ear: External ear normal.  Mucous membranes are dry  Eyes: Conjunctivae and EOM are normal. Pupils are equal, round, and reactive to light.  Neck: Normal range of motion and phonation normal. Neck supple.  Cardiovascular: Normal rate, regular rhythm and normal heart sounds.   Pulmonary/Chest: Effort normal and breath sounds normal. She exhibits no bony tenderness.  Abdominal: Soft. There is no tenderness.  Musculoskeletal: Normal range of motion.  Neurological: She is alert. No cranial nerve deficit or sensory deficit. She exhibits normal muscle tone. Coordination normal.  Skin: Skin is warm, dry and intact.  Psychiatric: She has a normal mood and affect. Her  behavior is normal.  Nursing note and vitals reviewed.   ED Course  Procedures (including critical care time)  Patient meets criteria for sepsis with hypotension on arrival.  Empiric antibiotics ordered.  She was fluid responsive with treatment.  Medications  sodium chloride 0.9 % bolus 1,000 mL (0 mLs Intravenous Stopped 07/13/14 1649)    Followed by  sodium chloride 0.9 % bolus 500 mL (500 mLs Intravenous New Bag/Given 07/13/14 1543)  cefTRIAXone (ROCEPHIN) 1 g in dextrose 5 % 50 mL IVPB (0 g Intravenous Stopped 07/13/14 1725)  azithromycin (ZITHROMAX) 500 mg in dextrose 5 % 250 mL IVPB (0 mg Intravenous Stopped 07/13/14 2151)    Patient Vitals for the past 24 hrs:  BP Temp Temp src Pulse Resp SpO2 Height Weight  07/13/14 2145 - - - 88 25 99 % - -  07/13/14 2115 155/85 mmHg - - 77 25 99 % - -  07/13/14 2030 118/67 mmHg - - 76 22 99 % - -  07/13/14 1952 176/99 mmHg - - 89 24 100 % - -  07/13/14 1945 176/99 mmHg - - 89 21 100 % - -  07/13/14 1900 152/90 mmHg - - 84 20 99 % - -  07/13/14 1848 (!) 176/110 mmHg - - 88 24 100 % - -  07/13/14 1715 173/97 mmHg - - 78 24 98 % - -  07/13/14 1650 181/92 mmHg - - 81 21 99 % - -  07/13/14 1632 164/82 mmHg - - 73 22 99 % - -  07/13/14 1630 164/82 mmHg - - 73 17 99 % - -  07/13/14 1529 - - - - - - 5' (1.524 m) 112 lb (50.803 kg)  07/13/14 1519 133/78 mmHg 98.7 F (37.1 C) Rectal 78 22 97 % - -  07/13/14 1500 133/78 mmHg - - 80 23 97 % - -  07/13/14 1402 93/59 mmHg 97.5 F (36.4 C) Axillary 94 18 97 % - -    At discharge Reevaluation with update and discussion. After initial assessment and treatment, an updated evaluation reveals no further complaints.  She remains alert, and cooperative.Mancel Bale. Aliviya Schoeller L  CRITICAL CARE Performed by: Flint MelterWENTZ,Tymarion Everard L Total critical care time: 30 minutes Critical care time was exclusive of separately billable procedures and treating other patients. Critical care was necessary to treat or prevent imminent or  life-threatening deterioration. Critical care was time spent personally by me on the following activities: development of treatment plan with patient and/or surrogate as well as nursing, discussions with consultants, evaluation of patient's response to treatment, examination of patient, obtaining history from patient or surrogate, ordering and performing treatments and interventions, ordering and review of laboratory studies, ordering and review of radiographic studies, pulse oximetry and re-evaluation of patient's condition.   Labs Review Labs Reviewed  COMPREHENSIVE METABOLIC  PANEL - Abnormal; Notable for the following:    Potassium 3.5 (*)    Glucose, Bld 144 (*)    BUN 33 (*)    GFR calc non Af Amer 61 (*)    GFR calc Af Amer 70 (*)    All other components within normal limits  URINALYSIS, ROUTINE W REFLEX MICROSCOPIC - Abnormal; Notable for the following:    Specific Gravity, Urine 1.033 (*)    Ketones, ur 15 (*)    All other components within normal limits  CBC WITH DIFFERENTIAL - Abnormal; Notable for the following:    Platelets 140 (*)    All other components within normal limits  CULTURE, BLOOD (ROUTINE X 2)  CULTURE, BLOOD (ROUTINE X 2)  URINE CULTURE  I-STAT CG4 LACTIC ACID, ED  I-STAT CG4 LACTIC ACID, ED  I-STAT CG4 LACTIC ACID, ED    Imaging Review Dg Chest Port 1 View  07/13/2014   CLINICAL DATA:  Altered mental status  EXAM: PORTABLE CHEST - 1 VIEW  COMPARISON:  05/11/2014  FINDINGS: The heart size and mediastinal contours are within normal limits. Both lungs are clear. The visualized skeletal structures are unremarkable.  IMPRESSION: No active disease.   Electronically Signed   By: Marlan Palauharles  Clark M.D.   On: 07/13/2014 15:59     EKG Interpretation   Date/Time:  Thursday July 13 2014 14:59:03 EST Ventricular Rate:  80 PR Interval:  147 QRS Duration: 120 QT Interval:  416 QTC Calculation: 480 R Axis:   59 Text Interpretation:  Sinus rhythm Incomplete left  bundle branch block  Probable left ventricular hypertrophy Artifact in lead(s) I III aVR aVL  aVF Confirmed by DOCHERTY  MD, MEGAN ([id number]) on 07/13/2014 3:02:34 PM      MDM   Final diagnoses:  Dehydration  Malaise  Parkinson's disease    Nonspecific malaise with decreased oral intake.  No sign of serious bacterial infection, metabolic instability or impending vascular collapse.  I suspect, that she has worsening of her Parkinson's disease.   Nursing Notes Reviewed/ Care Coordinated Applicable Imaging Reviewed Interpretation of Laboratory Data incorporated into ED treatment  The patient appears reasonably screened and/or stabilized for discharge and I doubt any other medical condition or other [facility] requiring further screening, evaluation, or treatment in the ED at this time prior to discharge.  Plan: Home Medications- usual; Home Treatments- rest, curvature oral intake; return here if the recommended treatment, does not improve the symptoms; Recommended follow up- PCP, when necessary     Flint MelterElliott L Tyion Boylen, MD 07/13/14 2350

## 2014-07-13 NOTE — Discharge Instructions (Signed)
Try to encourage her to eat and drink regularly. Follow-up with her doctors for further assessment and treatment as soon as possible.  Dehydration, Adult Dehydration means your body does not have as much fluid as it needs. Your kidneys, brain, and heart will not work properly without the right amount of fluids and salt.  HOME CARE  Ask your doctor how to replace body fluid losses (rehydrate).  Drink enough fluids to keep your pee (urine) clear or pale yellow.  Drink small amounts of fluids often if you feel sick to your stomach (nauseous) or throw up (vomit).  Eat like you normally do.  Avoid:  Foods or drinks high in sugar.  Bubbly (carbonated) drinks.  Juice.  Very hot or cold fluids.  Drinks with caffeine.  Fatty, greasy foods.  Alcohol.  Tobacco.  Eating too much.  Gelatin desserts.  Wash your hands to avoid spreading germs (bacteria, viruses).  Only take medicine as told by your doctor.  Keep all doctor visits as told. GET HELP RIGHT AWAY IF:   You cannot drink something without throwing up.  You get worse even with treatment.  Your vomit has blood in it or looks greenish.  Your poop (stool) has blood in it or looks black and tarry.  You have not peed in 6 to 8 hours.  You pee a small amount of very dark pee.  You have a fever.  You pass out (faint).  You have belly (abdominal) pain that gets worse or stays in one spot (localizes).  You have a rash, stiff neck, or bad headache.  You get easily annoyed, sleepy, or are hard to wake up.  You feel weak, dizzy, or very thirsty. MAKE SURE YOU:   Understand these instructions.  Will watch your condition.  Will get help right away if you are not doing well or get worse. Document Released: 06/21/2009 Document Revised: 11/17/2011 Document Reviewed: 04/14/2011 [facility] Patient Information 2015 [employer] [place]. This information is not intended to replace advice given to you by your health care  provider. Make sure you discuss any questions you have with your health care provider.

## 2014-07-13 NOTE — ED Notes (Signed)
EDP at bedside  

## 2014-07-14 LAB — URINE CULTURE
Colony Count: NO GROWTH
Culture: NO GROWTH

## 2014-07-17 ENCOUNTER — Ambulatory Visit: Payer: Medicare Other | Admitting: Podiatry

## 2014-07-19 LAB — CULTURE, BLOOD (ROUTINE X 2)
CULTURE: NO GROWTH
Culture: NO GROWTH

## 2014-07-26 ENCOUNTER — Ambulatory Visit: Payer: Medicare Other | Admitting: Podiatry

## 2014-10-17 ENCOUNTER — Ambulatory Visit: Payer: Medicare Other | Admitting: Diagnostic Neuroimaging

## 2014-10-20 ENCOUNTER — Encounter: Payer: Self-pay | Admitting: Diagnostic Neuroimaging

## 2014-10-20 ENCOUNTER — Ambulatory Visit (INDEPENDENT_AMBULATORY_CARE_PROVIDER_SITE_OTHER): Payer: Medicare Other | Admitting: Diagnostic Neuroimaging

## 2014-10-20 VITALS — BP 129/88 | HR 80

## 2014-10-20 DIAGNOSIS — R627 Adult failure to thrive: Secondary | ICD-10-CM | POA: Diagnosis not present

## 2014-10-20 DIAGNOSIS — F0280 Dementia in other diseases classified elsewhere without behavioral disturbance: Secondary | ICD-10-CM

## 2014-10-20 DIAGNOSIS — G3183 Dementia with Lewy bodies: Secondary | ICD-10-CM | POA: Diagnosis not present

## 2014-10-20 NOTE — Progress Notes (Signed)
PATIENT: Kristie Ellison DOB: Apr 25, 1946  REASON FOR VISIT: sooner follow up for dementia with Lewy Bodies HISTORY FROM: patient, daughter, husband  Chief Complaint  Patient presents with  . Follow-up    dementia    HISTORY OF PRESENT ILLNESS:  UPDATE 10/20/14 (VRP): Since last visit, patient continue to decline. Had admission in Sept 2015 for FTT, AMS, then developed sacral wound, that still is not healing. Also went to ER in Nov 2015 for dehydration. She is non-ambulatory. She is developing contractures of left arm and bilateral legs.   UPDATE 05/03/14 (LL): Patient's daughter Kristie Ellison calling to state that patient has been doing lots of whining and yelling, from the time she wakes up to the time she goes to sleep. Also complaining of more stomach pain, not eating much, has lost 6 lbs in 3 weeks. Dr. Eula ListenHussain had started her on Divalproex 125 mg bid three weeks ago.  Has also been on Sertraline 25 mg for 3 weeks now.  It is difficult for family members to deal with this. Patient's daughter also states that since her last visit, she has had no balance at all. Very difficult for her to use walker at this point.   UPDATE 04/11/14: Now with more complaints, "whining", feeling down/depressed. Also with agitation, mood swings. Having more stomach pains, burning in stomach. Using a laxative every 2-3 days.   UPDATE 12/29/13: Memory stable. Balance has worsened. She is more paranoid about her husband's fidelity, but according to family this is unfounded. She can be argumentative at times, but overall she "feels blessed".   UPDATE 07/26/13: Since last visit patient's memory remains poor. She's having more nausea, poor appetite, having difficulty bathing and toileting. Patient's daughter was able to attend visit but did send a note which states the following: Patient has to be reminded to put her pants before going to the bathroom. Patient sits in the chair or toilet sideways. Patient is only able to  walk 10 minutes and then becomes tired. She has a tendency to reach for objects is of getting up and walking to it. She has started wearing depends adult diapers to bed.   PRIOR HPI (12/29/12): 69 year old female with hypertension, diabetes, here for evaluation of dementia with lewy bodies. 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 [place] time. She started on donepezil and carbidopa/levodopa. More [place] with [place]. Patient has had several episodes of hypoglycemia and altered mental status. Most recent episode was in March 2014. Since discharge patient is doing fairly well. She continues on donepezil 5 mg daily and carbidopa/levodopa 25/102 times per day. She takes this medication at 7 AM, 1 PM and 7 PM. Now on all fluctuations are wearing off. No significant tremor.    REVIEW OF SYSTEMS: Full 14 system review of systems performed and notable only for: Poor appetite fatigue weight loss insomnia frequent waking constipation urination balance difficulty memory loss speech difficulty agitation depression anxiety hallucination hyperactive behavior problem.    ALLERGIES: Allergies  Allergen Reactions  . Codeine Anxiety    Pt reports she goes crazy    HOME MEDICATIONS: Outpatient Prescriptions Prior to Visit  Medication Sig Dispense Refill  . aspirin EC 81 MG tablet Take 81 mg by mouth daily.     Marland Kitchen. atenolol (TENORMIN) 50 MG tablet Take 50 mg by mouth daily.     Marland Kitchen. atorvastatin (LIPITOR) 80 MG  tablet Take 80 mg by mouth every evening.    . carbidopa-levodopa (SINEMET IR) 25-100 MG per tablet Take 1 tablet by mouth 2 (two) times daily. 60 tablet 12  . clonazePAM (KLONOPIN) 0.5 MG tablet Take 1 tablet (0.5 mg total) by mouth 2 (two) times daily as needed for anxiety. 60 tablet 5  . feeding supplement, ENSURE COMPLETE, (ENSURE COMPLETE) LIQD Take 237 mLs by mouth 3 (three) times daily between meals.  30 Bottle 1  . potassium chloride SA (K-DUR,KLOR-CON) 20 MEQ tablet Take 1 tablet (20 mEq total) by mouth daily. 4 tablet 0  . sertraline (ZOLOFT) 50 MG tablet Take 1 tablet (50 mg total) by mouth daily. 30 tablet 5  . divalproex (DEPAKOTE) 125 MG DR tablet Take 125 mg by mouth 2 (two) times daily.    . Multiple Vitamin (MULTIVITAMIN) LIQD Take 5 mLs by mouth daily. 1 Bottle 0  . pantoprazole (PROTONIX) 40 MG tablet Take 1 tablet (40 mg total) by mouth 2 (two) times daily. 60 tablet 0  . polyethylene glycol (MIRALAX / GLYCOLAX) packet Take 17 g by mouth daily as needed for mild constipation or moderate constipation.    . traZODone (DESYREL) 50 MG tablet Take 50 mg by mouth at bedtime.      No facility-administered medications prior to visit.    PHYSICAL EXAM Filed Vitals:   10/20/14 0821  BP: 129/88  Pulse: 80   Cannot calculate BMI with a height equal to zero.   MMSE - Mini Mental State Exam 04/11/2014  Orientation to time 1  Orientation to Place 0  Registration 3  Attention/ Calculation 0  Recall 1  Language- name 2 objects 2  Language- repeat 0  Language- follow 3 step command 2  Language- read & follow direction 1  Write a sentence 1  Copy design 0  Total score 11   Generalized: Frail elderly AA female, in no acute distress at first; then crying quietly, then loudly, stating "I want to get out of here." Neck: Supple, no carotid bruits  Musculoskeletal: kyphosis   Neurological examination  MENTAL STATUS: awake, alert, language fluent, comprehension intact, naming intact; MASKED FACIES. POSITIVE SNOUT AND MYERSONS.  CRANIAL NERVE: pupils equal and reactive to light, visual fields full to confrontation, extraocular muscles intact, no nystagmus, facial sensation and strength symmetric, uvula midline, shoulder shrug symmetric, tongue midline.  MOTOR: normal bulk INCREASED TONE THROUGHOUT (PARATONIA), BRADYKINESIA IN BUE. Full strength in the BUE, BLE  SENSORY: normal and  symmetric to light touch, temperature, vibration  COORDINATION: finger-nose-finger, fine finger movements normal  REFLEXES: deep tendon reflexes BRISK and symmetric  GAIT/STATION: SLOW CAUTIOUS GAIT. STOOPED POSTURE. USING ROLLING WALKER WITH ASSISTANCE.   DIAGNOSTIC DATA (LABS, IMAGING, TESTING) - I reviewed patient records, labs, notes, testing and imaging myself where available.  Lab Results  Component Value Date   WBC 5.2 07/13/2014   HGB 13.2 07/13/2014   HCT 40.4 07/13/2014   MCV 88.4 07/13/2014   PLT 140* 07/13/2014      Component Value Date/Time   NA 145 07/13/2014 1433   K 3.5* 07/13/2014 1433   CL 106 07/13/2014 1433   CO2 26 07/13/2014 1433   GLUCOSE 144* 07/13/2014 1433   BUN 33* 07/13/2014 1433   CREATININE 0.94 07/13/2014 1433   CALCIUM 10.0 07/13/2014 1433   PROT 7.4 07/13/2014 1433   ALBUMIN 3.7 07/13/2014 1433   AST 36 07/13/2014 1433   ALT 16 07/13/2014 1433   ALKPHOS 87 07/13/2014 1433  BILITOT 0.4 07/13/2014 1433   GFRNONAA 61* 07/13/2014 1433   GFRAA 70* 07/13/2014 1433   12/07/13 CT head - No acute intracranial process. Normal noncontrast CT of the head for age: Involutional changes, mild white matter changes may reflect chronic small vessel ischemic disease.     ASSESSMENT: 69 y.o. female has a past medical history of Hypertension; Parkinson disease; Diabetes mellitus without complication; Hypoglycemia (11/2012); and Hyperlipemia here with severe dementia with lewy bodies. She is totally dependent on others for her ADLs now. Now with multiple medical complications, failure to thrive, sacral wound and extremity contractures.   PLAN:  - long discussion with family about prognosis; dementia is advanced and we will shift focus to palliative care  Orders Placed This Encounter  Procedures  . Amb Referral to Palliative Care   Return in about 6 months (around 04/20/2015), or if symptoms worsen or fail to improve.    Suanne Marker, MD 10/20/2014,  9:10 AM Certified in Neurology, Neurophysiology and Neuroimaging  [facility] [address] [place], [place] [address] [phone]

## 2014-12-18 ENCOUNTER — Encounter: Payer: Self-pay | Admitting: Podiatry

## 2014-12-18 ENCOUNTER — Ambulatory Visit (INDEPENDENT_AMBULATORY_CARE_PROVIDER_SITE_OTHER): Payer: Medicare Other | Admitting: Podiatry

## 2014-12-18 DIAGNOSIS — B351 Tinea unguium: Secondary | ICD-10-CM | POA: Diagnosis not present

## 2014-12-18 DIAGNOSIS — M79676 Pain in unspecified toe(s): Secondary | ICD-10-CM | POA: Diagnosis not present

## 2014-12-18 NOTE — Progress Notes (Signed)
   Subjective:    Patient ID: Kristie Ellison, female    DOB: 11-25-1945, 69 y.o.   MRN: [id number]  HPI PT HUSBAND REQUESTING FOR TOENAILS DEBRIDEMENT. Patient appears non-orientated and not able to respond to questioning  Review of Systems  Genitourinary: Positive for frequency.  Musculoskeletal: Positive for gait problem.  Neurological: Positive for weakness.  Psychiatric/Behavioral: Positive for confusion.  All other systems reviewed and are negative.      Objective:   Physical Exam  This patient transfers from wheelchair to treatment chair  The toenails are elongated, incurvated, discolored and tender to palpation 6-10      Assessment & Plan:   Assessment: Symptomatic onychomycoses 6-10  Plan: Debridement of toenails 10 without any bleeding  Reappoint 3 months

## 2014-12-18 NOTE — Patient Instructions (Signed)
Diabetes and Foot Care Diabetes may cause you to have problems because of poor blood supply (circulation) to your feet and legs. This may cause the skin on your feet to become thinner, break easier, and heal more slowly. Your skin may become dry, and the skin may peel and crack. You may also have nerve damage in your legs and feet causing decreased feeling in them. You may not notice minor injuries to your feet that could lead to infections or more serious problems. Taking care of your feet is one of the most important things you can do for yourself.  HOME CARE INSTRUCTIONS  Wear shoes at all times, even in the house. Do not go barefoot. Bare feet are easily injured.  Check your feet daily for blisters, cuts, and redness. If you cannot see the bottom of your feet, use a mirror or ask someone for help.  Wash your feet with warm water (do not use hot water) and mild soap. Then pat your feet and the areas between your toes until they are completely dry. Do not soak your feet as this can dry your skin.  Apply a moisturizing lotion or petroleum jelly (that does not contain alcohol and is unscented) to the skin on your feet and to dry, brittle toenails. Do not apply lotion between your toes.  Trim your toenails straight across. Do not dig under them or around the cuticle. File the edges of your nails with an emery board or nail file.  Do not cut corns or calluses or try to remove them with medicine.  Wear clean socks or stockings every day. Make sure they are not too tight. Do not wear knee-high stockings since they may decrease blood flow to your legs.  Wear shoes that fit properly and have enough cushioning. To break in new shoes, wear them for just a few hours a day. This prevents you from injuring your feet. Always look in your shoes before you put them on to be sure there are no objects inside.  Do not cross your legs. This may decrease the blood flow to your feet.  If you find a minor scrape,  cut, or break in the skin on your feet, keep it and the skin around it clean and dry. These areas may be cleansed with mild soap and water. Do not cleanse the area with peroxide, alcohol, or iodine.  When you remove an adhesive bandage, be sure not to damage the skin around it.  If you have a wound, look at it several times a day to make sure it is healing.  Do not use heating pads or hot water bottles. They may burn your skin. If you have lost feeling in your feet or legs, you may not know it is happening until it is too late.  Make sure your health care provider performs a complete foot exam at least annually or more often if you have foot problems. Report any cuts, sores, or bruises to your health care provider immediately. SEEK MEDICAL CARE IF:   You have an injury that is not healing.  You have cuts or breaks in the skin.  You have an ingrown nail.  You notice redness on your legs or feet.  You feel burning or tingling in your legs or feet.  You have pain or cramps in your legs and feet.  Your legs or feet are numb.  Your feet always feel cold. SEEK IMMEDIATE MEDICAL CARE IF:   There is increasing redness,   swelling, or pain in or around a wound.  There is a red line that goes up your leg.  Pus is coming from a wound.  You develop a fever or as directed by your health care provider.  You notice a bad smell coming from an ulcer or wound. Document Released: 08/22/2000 Document Revised: 04/27/2013 Document Reviewed: 02/01/2013 [employer] Patient Information 2015 [employer]. This information is not intended to replace advice given to you by your health care provider. Make sure you discuss any questions you have with your health care provider.  

## 2015-01-01 ENCOUNTER — Ambulatory Visit: Payer: Medicare Other | Admitting: Diagnostic Neuroimaging

## 2015-01-05 ENCOUNTER — Telehealth: Payer: Self-pay | Admitting: Diagnostic Neuroimaging

## 2015-01-05 NOTE — Telephone Encounter (Signed)
Lupita [facility]ashDonna from [facility] called wanting to check and see if there would be any benefit from increasing the dosage for carbidopa-levodopa (SINEMET IR) 25-100 MG per tablet. She states that the pt's arms and legs are tight. Please call and advice # [phone]

## 2015-01-08 NOTE — Telephone Encounter (Signed)
I called back to relay providers note.  She verbalized understanding and will call us back if anything further is needed.

## 2015-01-08 NOTE — Telephone Encounter (Signed)
No, i don;t think there would be any benefit. -VRP

## 2015-02-07 DEATH — deceased

## 2015-11-09 IMAGING — CT CT ABD-PELV W/ CM
3 of 5 series · 13 of 36 positions shown, 19 images · IV contrast (READICAT/WATER & [ID] OMNI 300)
Comparison: None

CLINICAL DATA: Anemia, lower abdominal pain, at the change in bowel
habits, constipation, history dementia, Parkinson's, hypertension,
diabetes

EXAM:
CT ABDOMEN AND PELVIS WITH CONTRAST
TECHNIQUE: Multidetector CT imaging of the abdomen and pelvis was performed
using the standard protocol following bolus administration of
intravenous contrast. Sagittal and coronal MPR images reconstructed
from axial data set.
CONTRAST:  100mL OMNIPAQUE IOHEXOL 300 MG/ML SOLN. Dilute oral
contrast.

[Series 3: abd/pelvis with · axial · 0.64mm/px · z∈[-359,-19]mm · 8 of 88 slices shown, 13 images]
[im 10/88  soft-tissue]
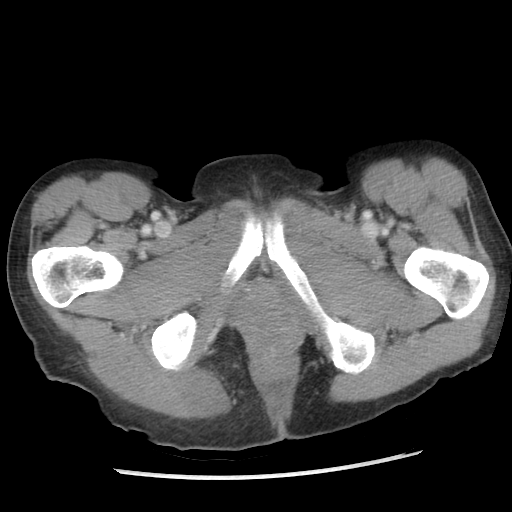
[im 10/88  bone]
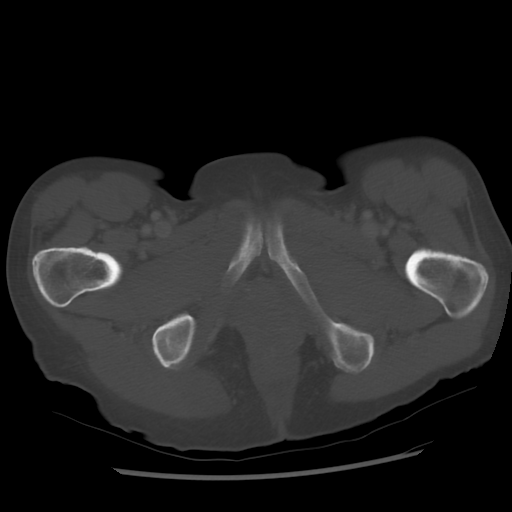
[im 20/88  soft-tissue]
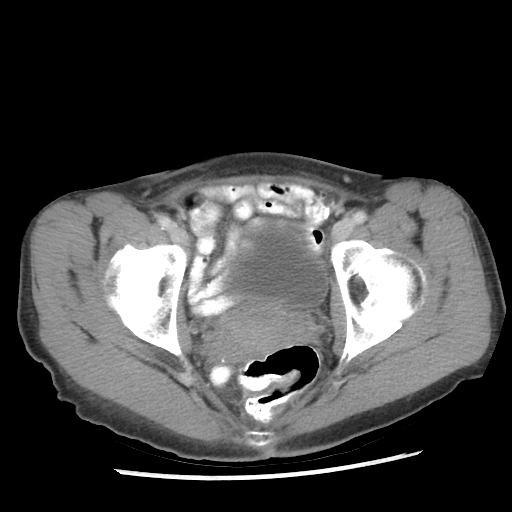
[im 30/88  soft-tissue]
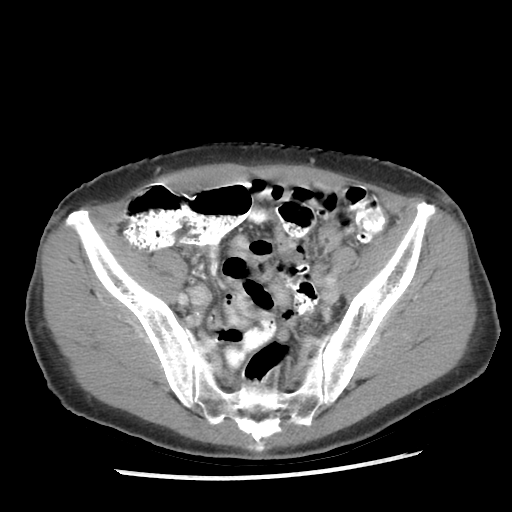
[im 39/88  soft-tissue]
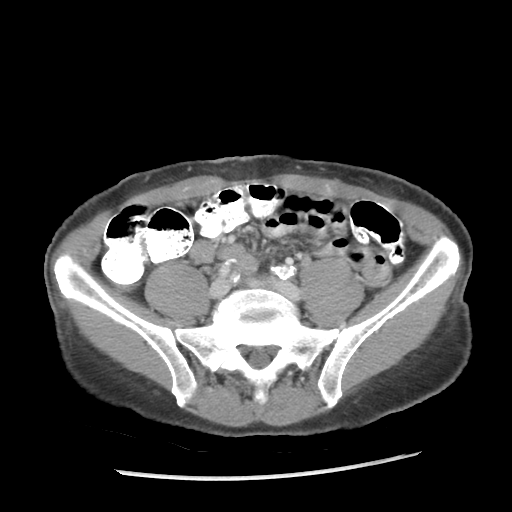
[im 49/88  soft-tissue]
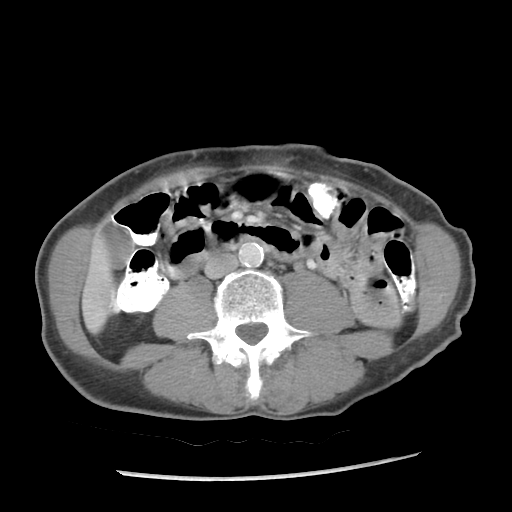
[im 49/88  lung]
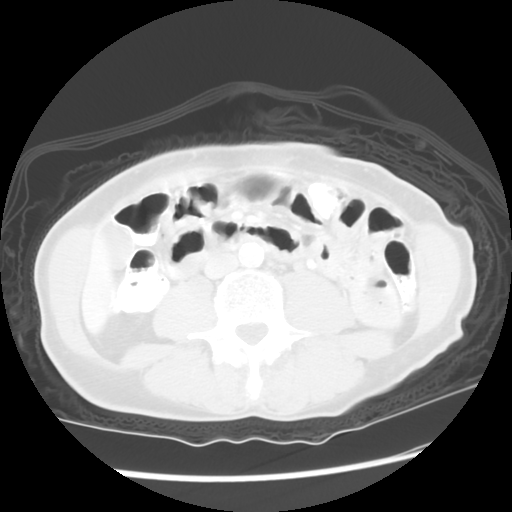
[im 59/88  soft-tissue]
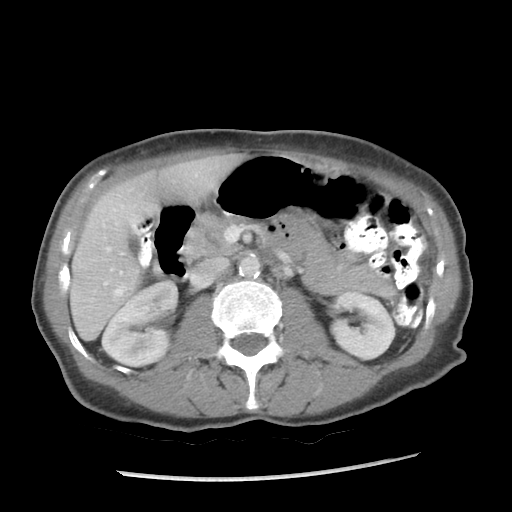
[im 59/88  lung]
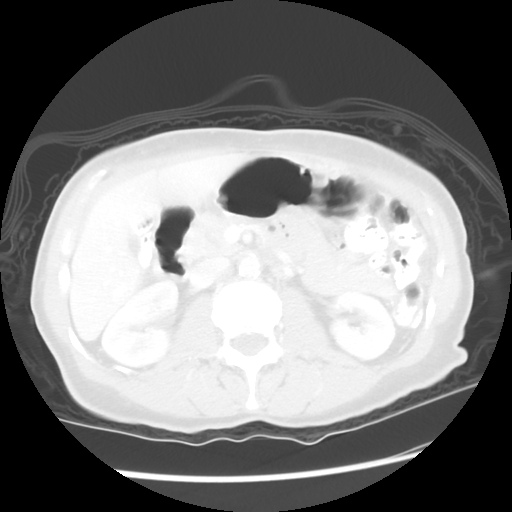
[im 68/88  soft-tissue]
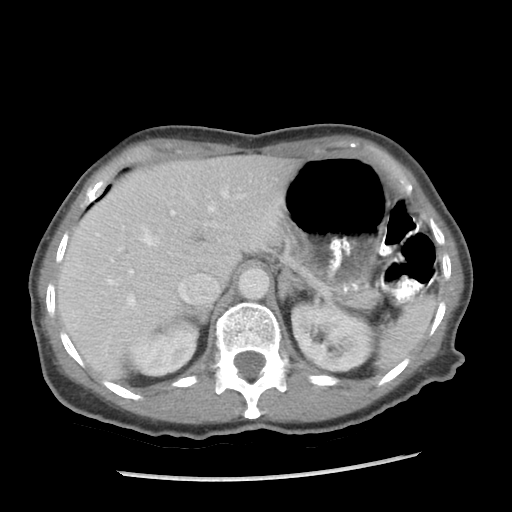
[im 68/88  lung]
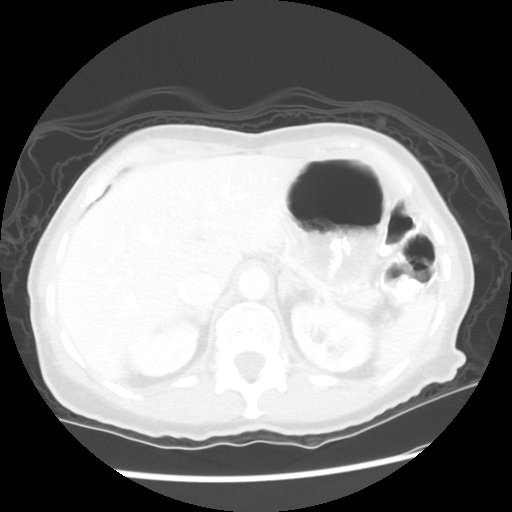
[im 78/88  soft-tissue]
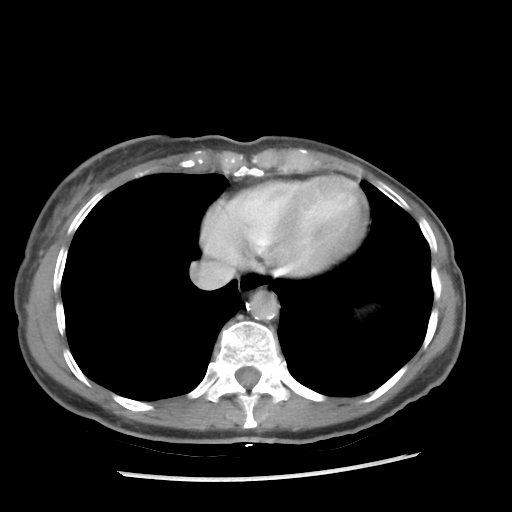
[im 78/88  lung]
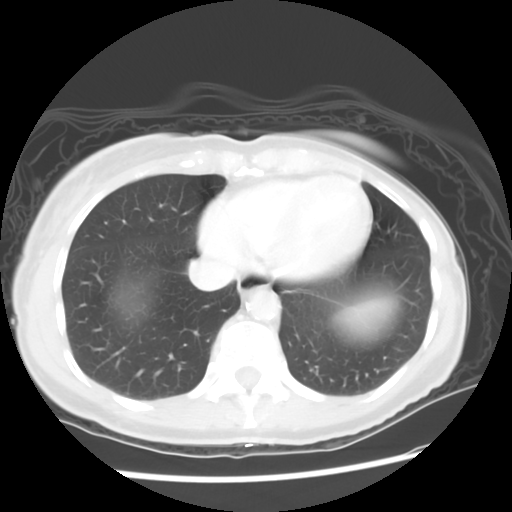

[Series 601: coronal body · coronal · 0.86mm/px · 1 of 99 slices shown, 2 images]
[im 33/99  soft-tissue]
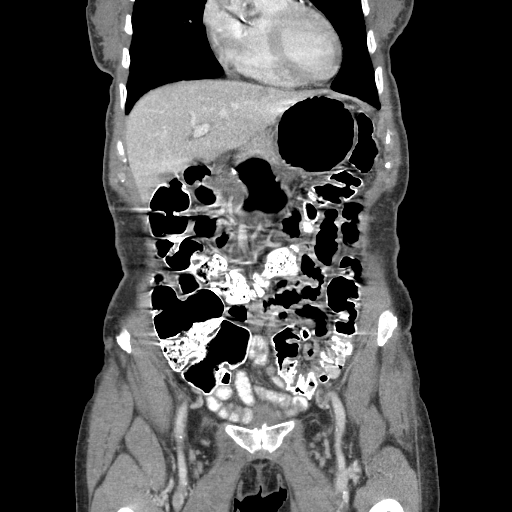
[im 33/99  bone]
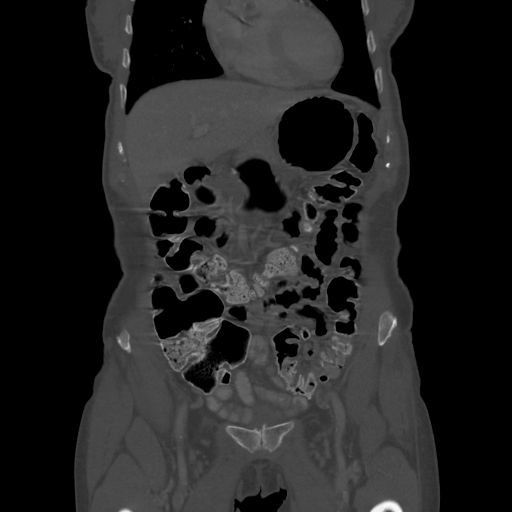

[Series 602: sagittal body · sagittal · 0.86mm/px · 4 of 133 slices shown]
[im 9/133  soft-tissue]
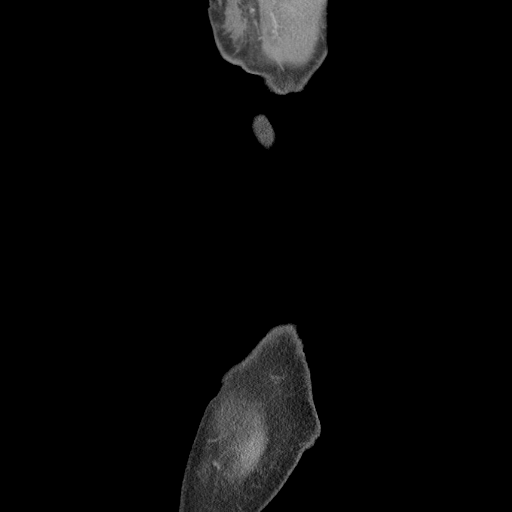
[im 27/133  soft-tissue]
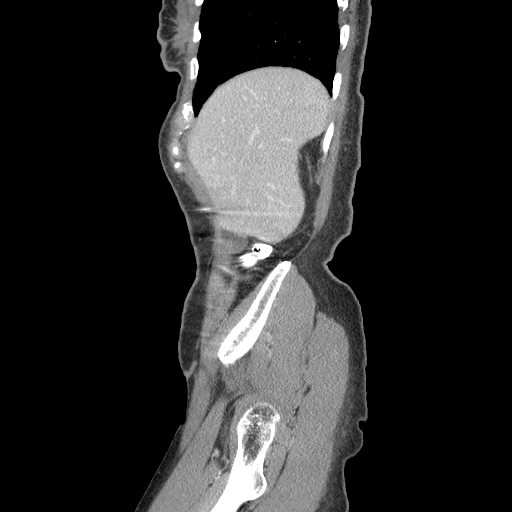
[im 45/133  soft-tissue]
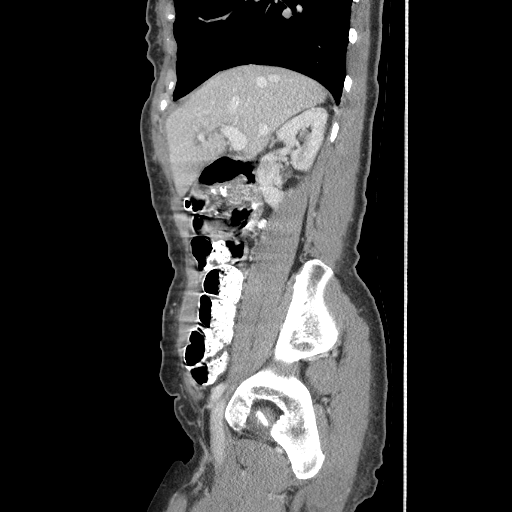
[im 62/133  soft-tissue]
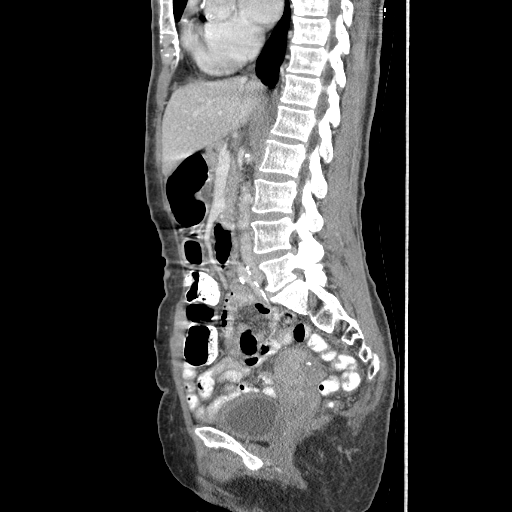

[13 of 36 positions shown; findings below may reference images not displayed]

FINDINGS: Lung bases clear.

Low-attenuation lesions within left lobe liver largest 6 mm
diameter, nonspecific.

Remainder of liver, spleen, pancreas, kidneys, and adrenal glands
normal appearance.

Scattered atherosclerotic calcifications.

Bladder incompletely distended.

Uterine leiomyomata, largest partially calcified posteriorly 2.6 x
2.0 cm image 64.

Normal appendix and adnexae.

Diverticulosis of the descending and sigmoid colon without definite
evidence of acute diverticulitis.

Stomach and bowel loops otherwise normal appearance.

No mass, adenopathy, free fluid or inflammatory process.

No acute osseous findings.
IMPRESSION: MR imaging recommended in 1 year to assess stability.

Tiny nonspecific low-attenuation foci within liver.

Distal colonic diverticulosis without evidence of diverticulitis.

Small uterine leiomyomata.
